# Patient Record
Sex: Male | Born: 1975 | Hispanic: Yes | Marital: Married | Smoking: Never smoker
Health system: Southern US, Community
[De-identification: ages and names within clinical notes are randomized; demographics above are authoritative.]

## PROBLEM LIST (undated history)

## (undated) DIAGNOSIS — K2970 Gastritis, unspecified, without bleeding: Secondary | ICD-10-CM

## (undated) DIAGNOSIS — K219 Gastro-esophageal reflux disease without esophagitis: Secondary | ICD-10-CM

## (undated) DIAGNOSIS — F32A Depression, unspecified: Secondary | ICD-10-CM

## (undated) DIAGNOSIS — R945 Abnormal results of liver function studies: Secondary | ICD-10-CM

## (undated) DIAGNOSIS — F329 Major depressive disorder, single episode, unspecified: Secondary | ICD-10-CM

## (undated) DIAGNOSIS — R7989 Other specified abnormal findings of blood chemistry: Secondary | ICD-10-CM

## (undated) HISTORY — DX: Abnormal results of liver function studies: R94.5

## (undated) HISTORY — DX: Depression, unspecified: F32.A

## (undated) HISTORY — DX: Other specified abnormal findings of blood chemistry: R79.89

## (undated) HISTORY — DX: Gastro-esophageal reflux disease without esophagitis: K21.9

## (undated) HISTORY — PX: OTHER SURGICAL HISTORY: SHX169

## (undated) HISTORY — DX: Gastritis, unspecified, without bleeding: K29.70

## (undated) HISTORY — PX: NO PAST SURGERIES: SHX2092

## (undated) HISTORY — DX: Major depressive disorder, single episode, unspecified: F32.9

---

## 2011-07-31 ENCOUNTER — Ambulatory Visit: Payer: Self-pay

## 2011-07-31 ENCOUNTER — Ambulatory Visit: Payer: Self-pay | Admitting: Family Medicine

## 2011-07-31 VITALS — BP 121/75 | HR 91 | Temp 98.5°F | Resp 18 | Ht 66.0 in | Wt 154.0 lb

## 2011-07-31 DIAGNOSIS — R519 Headache, unspecified: Secondary | ICD-10-CM

## 2011-07-31 DIAGNOSIS — R5383 Other fatigue: Secondary | ICD-10-CM

## 2011-07-31 DIAGNOSIS — R05 Cough: Secondary | ICD-10-CM

## 2011-07-31 DIAGNOSIS — R059 Cough, unspecified: Secondary | ICD-10-CM

## 2011-07-31 DIAGNOSIS — R062 Wheezing: Secondary | ICD-10-CM

## 2011-07-31 MED ORDER — AZITHROMYCIN 250 MG PO TABS: ORAL_TABLET | ORAL | Status: AC

## 2011-07-31 MED ORDER — BENZONATATE 100 MG PO CAPS: 100.0000 mg | ORAL_CAPSULE | Freq: Three times a day (TID) | ORAL | Status: AC | PRN

## 2011-07-31 MED ORDER — ALBUTEROL SULFATE HFA 108 (90 BASE) MCG/ACT IN AERS: 2.0000 | INHALATION_SPRAY | RESPIRATORY_TRACT | Status: DC | PRN

## 2011-07-31 NOTE — Patient Instructions (Signed)
Get lots of rest and drink at least 64 ounces of water daily.  Restart the medicine for your gastritis. If you are not SUBSTANTIALLY better when you complete the antibiotic, return for re-evaluation.  Come back sooner if you get worse.

## 2011-07-31 NOTE — Progress Notes (Signed)
  Subjective:    Patient ID: Anthony Kirby, male    DOB: Sep 08, 1975, 36 y.o.   MRN: [id number]  HPI  Presents with cough, HA and fatigue x 2 weeks.  Initially also had sinus symptoms, but those are now resolved.  Describes the sensation of "something" in his lung on the right. No fever, chills, GI or GU symptoms.  No myalgias, arthralgias or rash. No SOB.  Non-smoker.  No history of lung problems or asthma. History of gastritis.  Usually takes omeprazole, but stopped it when these symptoms began.  Review of Systems As above.    Objective:   Physical Exam  Vital signs noted. Well-developed, well nourished [place] man who is awake, alert and oriented, in NAD. HEENT: [place]/AT, PERRL, EOMI.  Sclera and conjunctiva are clear.  Fundi are normal bilaterally. EAC are patent, TMs are normal in appearance. Nasal mucosa is pink and moist. OP is clear. Neck: supple, non-tender, no lymphadenopathy, thyromegaly. Heart: RRR, no murmur Lungs: high-pitched musical wheezes throughout, worse on the right than the left. Abdomen: normo-active bowel sounds, supple, non-tender, no mass or organomegaly. Extremities: no cyanosis, clubbing or edema. Skin: warm and dry without rash.   CXR: [facility] reading (PRIMARY) by  Dr. Conley Rolls. Increased markings bilaterally, but no discrete infiltrates.      Assessment & Plan:   1. Cough  DG Chest 2 View, azithromycin (ZITHROMAX) 250 MG tablet, benzonatate (TESSALON) 100 MG capsule  2. Wheezing  DG Chest 2 View, albuterol (PROVENTIL HFA;VENTOLIN HFA) 108 (90 BASE) MCG/ACT inhaler  3. HA (headache)    4. Fatigue     Patient Instructions  Get lots of rest and drink at least 64 ounces of water daily.  Restart the medicine for your gastritis. If you are not SUBSTANTIALLY better when you complete the antibiotic, return for re-evaluation.  Come back sooner if you get worse.   Discussed with Dr. Conley Rolls.

## 2011-08-06 ENCOUNTER — Ambulatory Visit: Payer: Self-pay | Admitting: Family Medicine

## 2011-08-06 VITALS — BP 99/64 | HR 73 | Temp 97.9°F | Resp 16 | Ht 65.5 in | Wt 153.8 lb

## 2011-08-06 DIAGNOSIS — H109 Unspecified conjunctivitis: Secondary | ICD-10-CM

## 2011-08-06 DIAGNOSIS — J40 Bronchitis, not specified as acute or chronic: Secondary | ICD-10-CM

## 2011-08-06 MED ORDER — TOBRAMYCIN 0.3 % OP SOLN: 1.0000 [drp] | Freq: Four times a day (QID) | OPHTHALMIC | Status: AC

## 2011-08-06 MED ORDER — AZITHROMYCIN 250 MG PO TABS: ORAL_TABLET | ORAL | Status: AC

## 2011-08-06 MED ORDER — PREDNISONE 20 MG PO TABS: 40.0000 mg | ORAL_TABLET | Freq: Every day | ORAL | Status: AC

## 2011-08-06 NOTE — Progress Notes (Signed)
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 He's here because of progressive eye redness with discharge, and also because of persistent cough and muscle aches for 3 weeks. He's tried Zyrtec without benefit. No history of asthma. Nonsmoker. Objective: Mild swelling of both eyelids, injected sclera but normal extraocular motion. Fundi normal.  Oropharynx: Mild erythema of the uvula  Neck: Supple no adenopathy or thyromegaly  Chest: Few rhonchi otherwise clear  Skin: No rashes  Heart: Regular, no murmur or gallop  Assessment: Acute bronchitis-persistent, conjunctivitis, acute  Plan: Good hygiene, Tobrex drops 4 times a day, Z-Pak, prednisone

## 2011-08-06 NOTE — Patient Instructions (Signed)
Bronquitis (Bronchitis) La bronquitis es Morgan [employer] (el modo que tiene el organismo de Publishing rights manager a una lesin o infeccin) de los bronquios Los bronquios son los conductos que se extienden desde la trquea The First American. Si la inflamacin se agrava, puede causar la falta de aire. CAUSAS Las causas de la inflamacin pueden ser:  Un virus   Grmenes (bacteria).   Polvo   Alergenos   La polucin y muchos otros irritantes  Las clulas que revisten el rbol bronquial estn cubiertas con pequeos pelos (cilias). Esta constantemente producen un movimiento desde los pulmones hacia la boca. De este modo se mantienen los pulmones libres de polucin. Cuando estas clulas se irritan y no pueden cumplir su funcin, comienza a formarse la mucosidad. Esto produce la caracterstica tos de la bronquitis. La tos es el mecanismo por el cual se limpian los pulmones cuando las cilias no pueden cumplir su funcin. Sin alguno de CBS Corporation, Animator se Psychologist, clinical los pulmones Entonces se desarrollara una pulmona.  El fumar es una de las causas ms frecuentes de bronquitis y puede contribuir a la neumona. Abandonar este hbito es lo ms importante que puede hacer para beneficiarse. TRATAMIENTO  El Office Depot prescribir antibiticos si la causa es una bacteria, y medicamentos para abrir las vas areas y Personnel officer. Tambin puede recomendar o prescribir un expectorante. El expectorante aflojar la mucosidad para que pueda eliminarla. Slo tome medicamentos de Sales promotion account executive o prescriptos para Primary school teacher, las [place], o bajar la fiebre segn las indicaciones de su mdico.   Pharmacologist todo lo que causa el problema (por ejemplo el hbito de Art therapist) es fundamental para evitar que empeore.   Un antitusgeno puede prescribirse para [employer]bury [employer] Group de la tos.   Podrn indicarle inhalantes para aliviar los sntomas actuales y ayudar a prevenir problemas futuros.    Aquellos que sufren bronquitis crnica (recurrente) puede ser necesaria la administracin de corticoides.  SOLICITE ATENCIN MDICA INMEDIATAMENTE SI:  Durante el tratamiento observa que elimina esputo similar a pus (purulento).   Tiene fiebre.   Su beb tiene ms de 3 meses y su temperatura rectal es de 102 F (38.9 C) o ms.   Su beb tiene 3 meses o menos y su temperatura rectal es de 100.4 F (38 C) o ms.   Se siente cada vez ms enfermo.   Tiene cada vez ms dificultad para respirar, tiene ruidos al respirar o Company secretary.  Es necesario buscar atencin mdica inmediata si es [place] persona de edad avanzada o sufre alguna otra enfermedad. ASEGURESE DE QUE:   Comprende estas instrucciones.   Controlar su enfermedad.   Solicitar ayuda inmediatamente si no mejora o si empeora.  Document Released: 03/13/2005 Document Revised: 03/02/2011 [facility] Patient Information 2012 [place], [place].Conjuntivitis (Conjunctivitis) Usted padece conjuntivitis. La conjuntivitis se conoce frecuentemente como "ojo rojo". Las causas de la conjuntivitis pueden ser las infecciones virales o [place], Environmental consultant o lesiones. Los sntomas son: enrojecimiento de la superficie del ojo, picazn, molestias y en algunos casos, secreciones. La secrecin se deposita en las pestaas. Las infecciones virales causan una secrecin acuosa, mientras que las infecciones bacterianas causan una secrecin amarillenta y espesa. La conjuntivitis es muy contagiosa y se disemina por el contacto directo. Devon Energy parte del tratamiento le indicaran gotas oftlmicas con antibiticos. Antes de Apache Corporation, retire todas la secreciones del ojo, lavndolo suavemente con agua tibia y algodn. Contine con el uso del medicamento hasta que se haya Unisys Corporation  maanas sin secrecin ocular. No se frote los ojos. Esto hace que aumente la irritacin y favorece la extensin de la infeccin. No utilice las Lear Corporation  miembros de [place]. Lvese las manos con agua y [place] antes y despus de tocarse los ojos. Utilice compresas fras para reducir Chief Technology Officer y anteojos de sol para disminuir la irritacin que ocasiona la luz. No debe usarse maquillaje ni lentes de contacto hasta que la infeccin haya desaparecido. SOLICITE ATENCIN MDICA SI:  Sus sntomas no mejoran luego de [address]  [address] de [place].   Aumenta el dolor o las dificultades para ver.   La zona externa de los prpados est muy roja o hinchada.  Document Released: 03/13/2005 Document Revised: 03/02/2011 [facility] Patient Information 2012 [place], [place].

## 2011-08-13 ENCOUNTER — Ambulatory Visit: Payer: Self-pay | Admitting: Family Medicine

## 2011-08-13 VITALS — BP 129/75 | HR 75 | Temp 97.9°F | Resp 18 | Ht 66.0 in | Wt 154.0 lb

## 2011-08-13 DIAGNOSIS — J029 Acute pharyngitis, unspecified: Secondary | ICD-10-CM

## 2011-08-13 DIAGNOSIS — B351 Tinea unguium: Secondary | ICD-10-CM

## 2011-08-13 MED ORDER — TERBINAFINE HCL 250 MG PO TABS: 250.0000 mg | ORAL_TABLET | Freq: Every day | ORAL | Status: AC

## 2011-08-13 MED ORDER — DOXYCYCLINE HYCLATE 100 MG PO TABS: 100.0000 mg | ORAL_TABLET | Freq: Two times a day (BID) | ORAL | Status: AC

## 2011-08-13 NOTE — Patient Instructions (Signed)
Tia de las uas (Ringworm, Nail) Usted presenta una infeccin por hongos en las uas de los pies. La parte visible de las uas est formada por clulas muertas que no tienen suministro sanguneo que intervenga en la prevencin de las infecciones. La infeccin se produce debido a que los hongos estn en todas partes. Aprovecharn cualquier oportunidad para crecer Presenter, broadcasting. Esto incluye los tejidos de su cuerpo formados por General Electric.  [place] uas tienen un crecimiento muy lento, requieren [place] 2 aos de tratamiento con medicamentos antimicticos. La infeccin involucra a toda la ua, hasta la base. Incluye aproximadamente 1/3 de la ua que no puede verse. Si el profesional le ha prescrito un medicamento por boca, United Auto. No podr ver ningn progreso hasta que hayan transcurrido entre 6 y 9 meses. No debe preocuparse. La curacin es lenta. Se debe a que el medicamento llega hasta la infeccin de manera muy lenta. Los hongos pueden vivir sobre las clulas muertas con poca o casi ninguna exposicin al suministro de [place]. Esta tambin es la razn por la cual no se observa mejora en los primeros 6 meses. La ua comienza la curacin en la base, donde hay suministro de [place]. La medicacin tpica, como las cremas y los ungentos generalmente no son eficaces. Channing Mutters al profesional podrn elegir acelerar el proceso de curacin con la extraccin quirrgica de todas las uas. An as, Pharmacist, hospital 6 y 9 meses de medicamentos por va oral adicionales. Concurra a la Training and development officer profesional que lo asiste de acuerdo a lo que le haya indicado. Recuerde que no observar mejora durante al menos 6 meses. Consulte antes con el profesional si aparecen otros signos de infeccin (p. ej. enrojecimiento e hinchazn). Document Released: 12/21/2004 Document Revised: 03/02/2011 [facility] Patient Information 2012 [place], [place].

## 2011-08-13 NOTE — Progress Notes (Signed)
This 36 year old man comes back because of persistent sore throat and also wants to have his nails evaluated.  Objective: Patient is mild thickening and irritation around the distal aspects of his toenails and fingernails.  Throat is red with mild exudate on the left.  Neck is supple without adenopathy  His respirations normal  Assessment: Persistent pharyngitis and onychomycosis  Plan: Doxycycline for the sore throat x1 week and the Lamisil for one month

## 2012-03-23 ENCOUNTER — Ambulatory Visit: Payer: Self-pay | Admitting: Emergency Medicine

## 2012-03-23 VITALS — BP 105/66 | HR 82 | Temp 98.6°F | Resp 16 | Ht 66.5 in | Wt 162.0 lb

## 2012-03-23 DIAGNOSIS — K047 Periapical abscess without sinus: Secondary | ICD-10-CM

## 2012-03-23 DIAGNOSIS — J029 Acute pharyngitis, unspecified: Secondary | ICD-10-CM

## 2012-03-23 MED ORDER — CLINDAMYCIN HCL 300 MG PO CAPS: 300.0000 mg | ORAL_CAPSULE | Freq: Three times a day (TID) | ORAL | Status: DC

## 2012-03-23 NOTE — Progress Notes (Signed)
[facility] [address] [place] [address] [phone]  Date:  03/23/2012   Name:  Anthony Kirby   DOB:  1975/08/03   MRN:  [id number]  PCP:  No primary provider on file.    Chief Complaint: Sore Throat and Fever   History of Present Illness:  Anthony Kirby is a 36 y.o. very pleasant male patient who presents with the following:  Ill three days with sore throat and fever.  No cough but has nasal congestion and drainage. Nonpurulent.  Nausea and vomiting yesterday a couple times.  Poor po intake.  Has headache.  No rash or stool change.  Dental pain and left facial swelling past three weeks now worse.  Has a fractured tooth with an exposed root.  There is no problem list on file for this patient.   Past Medical History  Diagnosis Date  . Gastritis     History reviewed. No pertinent past surgical history.  History  Substance Use Topics  . Smoking status: Never Smoker   . Smokeless tobacco: Not on file  . Alcohol Use: No    Family History  Problem Relation Age of Onset  . Hypertension Mother     Allergies  Allergen Reactions  . Penicillins Hives    Medication list has been reviewed and updated.  Current Outpatient Prescriptions on File Prior to Visit  Medication Sig Dispense Refill  . terbinafine (LAMISIL) 250 MG tablet Take 1 tablet (250 mg total) by mouth daily.  30 tablet  2    Review of Systems:  As per HPI, otherwise negative.    Physical Examination: Filed Vitals:   03/23/12 1413  BP: 105/66  Pulse: 82  Temp: 98.6 F (37 C)  Resp: 16   Filed Vitals:   03/23/12 1413  Height: 5' 6.5" (1.689 m)  Weight: 162 lb (73.483 kg)   Body mass index is 25.76 kg/(m^2). Ideal Body Weight: Weight in (lb) to have BMI = 25: 156.9   GEN: WDWN, NAD, Non-toxic, A & O x 3 HEENT: Atraumatic, Normocephalic. Neck supple. No masses, No LAD.  Erythema no exudate.  Has left second molar mandible with large defect and facial swelling no visible  abscess Ears and Nose: No external deformity.  TM negative CV: RRR, No M/G/R. No JVD. No thrill. No extra heart sounds. PULM: CTA B, no wheezes, crackles, rhonchi. No retractions. No resp. distress. No accessory muscle use. ABD: S, NT, ND, +BS. No rebound. No HSM. EXTR: No c/c/e NEURO Normal gait.  PSYCH: Normally interactive. Conversant. Not depressed or anxious appearing.  Calm demeanor.    Assessment and Plan: Dental infection Pharyngitis Clindamycin Follow up as needed and with dentist  Carmelina Dane, MD

## 2012-03-23 NOTE — Patient Instructions (Addendum)
Absceso dental  (Dental Abscess)  Un absceso dental es la acumulacin de lquido infectado (pus) debido a una infeccin bacteriana en la parte interna del diente (pulpa). Generalmente se produce en la punta de la raz del diente.  CAUSAS   Caries dentales graves.  Traumatismo dental que permite el ingreso de bacterias en la pulpa, como en el caso de un diente roto o [place]. SNTOMAS   Dolor intenso en el interior y alrededor del diente infectado.  Hinchazn y enrojecimiento alrededor del diente, en la boca o en la cara.  Sensibilidad.  Secrecin de pus.  Mal aliento.  Gusto desagradable en la boca.  Dificultad para tragar.  Dificultad para abrir Government social research officer.  Nuseas.  Vmitos.  Escalofros.  Ganglios hinchados en el cuello. DIAGNSTICO   Deben tomarle una historia clnica y dental.  El examen consistir en punzar el absceso del diente.  Le tomarn radiografas del diente para identificar el absceso. TRATAMIENTO  El objetivo del tratamiento es eliminar la infeccin. Le indicarn antibiticos para evitar que la infeccin se extienda. Para salvar el diente, el dentista podra realizarle un tratamiento de conducto. Si el diente no puede salvarse, habr que sacarlo (extraerlo) y se drenar el absceso.  INSTRUCCIONES PARA EL CUIDADO EN EL HOGAR   Slo tome medicamentos de venta libre o recetados para The PNC Financial, la fiebre, o el [place], segn las indicaciones de su mdico.  Enjuguese la boca con frecuencia (haga buches) con agua y sal ( de cucharadita de sal en 240 ml. de agua tibia) para aliviar el dolor y la inflamacin.  No conduzca mientras toma analgsicos (narcticos).  No aplique calor en la parte externa del rostro.  Regrese para completar el tratamiento con el dentista, segn las indicaciones. SOLICITE ATENCIN MDICA SI:   El dolor no se alivia con los United Parcel.  El dolor empeora en vez de [place]. SOLICITE ATENCIN MDICA DE INMEDIATO SI:    Tiene fiebre o sntomas persistentes durante ms de 2  3 das.  Tiene fiebre y los sntomas [address]  Siente escalofros o un dolor de cabeza muy intenso.  Tiene problemas para respirar o tragar.  Tiene dificultad para abrir Government social research officer.  Observa hinchazn en el cuello o alrededor del ojo. Document Released: 03/13/2005 Document Revised: 09/12/2011 [facility] Patient Information 2013 [place], [place].

## 2014-07-24 ENCOUNTER — Ambulatory Visit (INDEPENDENT_AMBULATORY_CARE_PROVIDER_SITE_OTHER): Payer: Self-pay | Admitting: Family Medicine

## 2014-07-24 VITALS — BP 104/72 | HR 92 | Temp 98.4°F | Resp 20 | Ht 66.75 in | Wt 160.1 lb

## 2014-07-24 DIAGNOSIS — R059 Cough, unspecified: Secondary | ICD-10-CM

## 2014-07-24 DIAGNOSIS — R05 Cough: Secondary | ICD-10-CM

## 2014-07-24 DIAGNOSIS — J309 Allergic rhinitis, unspecified: Secondary | ICD-10-CM

## 2014-07-24 DIAGNOSIS — K219 Gastro-esophageal reflux disease without esophagitis: Secondary | ICD-10-CM

## 2014-07-24 MED ORDER — AZITHROMYCIN 250 MG PO TABS: ORAL_TABLET | ORAL | Status: DC

## 2014-07-24 NOTE — Patient Instructions (Signed)
claritin cada dia por alergias, mucinex por tos, y omeprazole cada dia por acido. si su tos remanecer en 5 dias - tome antibiotico (azithromycin)  regrese si su simptomas empeorse.   Cough, Adult  A cough is a reflex that helps clear your throat and airways. It can help heal the body or may be a reaction to an irritated airway. A cough may only last 2 or 3 weeks (acute) or may last more than 8 weeks (chronic).  CAUSES Acute cough:  Viral or bacterial infections. Chronic cough:  Infections.  Allergies.  Asthma.  Post-nasal drip.  Smoking.  Heartburn or acid reflux.  Some medicines.  Chronic lung problems (COPD).  Cancer. SYMPTOMS   Cough.  Fever.  Chest pain.  Increased breathing rate.  High-pitched whistling sound when breathing (wheezing).  Colored mucus that you cough up (sputum). TREATMENT   A bacterial cough may be treated with antibiotic medicine.  A viral cough must run its course and will not respond to antibiotics.  Your caregiver may recommend other treatments if you have a chronic cough. HOME CARE INSTRUCTIONS   Only take over-the-counter or prescription medicines for pain, discomfort, or fever as directed by your caregiver. Use cough suppressants only as directed by your caregiver.  Use a cold steam vaporizer or humidifier in your bedroom or home to help loosen secretions.  Sleep in a semi-upright position if your cough is worse at night.  Rest as needed.  Stop smoking if you smoke. SEEK IMMEDIATE MEDICAL CARE IF:   You have pus in your sputum.  Your cough starts to worsen.  You cannot control your cough with suppressants and are losing sleep.  You begin coughing up blood.  You have difficulty breathing.  You develop pain which is getting worse or is uncontrolled with medicine.  You have a fever. MAKE SURE YOU:   Understand these instructions.  Will watch your condition.  Will get help right away if you are not doing well or  get worse. Document Released: 09/09/2010 Document Revised: 06/05/2011 Document Reviewed: 09/09/2010 [facility] Patient Information 2015 [place], [place]. This information is not intended to replace advice given to you by your health care provider. Make sure you discuss any questions you have with your health care provider.  Opciones de alimentos para pacientes con reflujo gastroesofgico (Food Choices for Gastroesophageal Reflux Disease) Cuando se tiene reflujo gastroesofgico (ERGE), los alimentos que se ingieren y los hbitos de alimentacin son muy importantes. Elegir los alimentos adecuados puede ayudar a Paramedicaliviar las molestias ocasionadas por el St. JosephERGE. QU PAUTAS GENERALES DEBO SEGUIR?  Elija las frutas, los vegetales, los cereales integrales, los productos lcteos, la carne de [place], de pescado y de ave con bajo contenido de grasas.  Limite las grasas, [address] Lanecomo los [place]lortownaceites, los aderezos para Quinwoodensalada, la Peabodymanteca, los frutos secos y Programme researcher, broadcasting/film/videoel aguacate.  Lleve un registro de las comidas para identificar los alimentos que ocasionan sntomas.  Evite los alimentos que le ocasionen reflujo. Pueden ser distintos para cada persona.  Haga comidas pequeas con frecuencia en lugar de tres comidas OfficeMax Incorporatedabundantes todos los das.  Coma lentamente, en un clima distendido.  Limite el consumo de alimentos fritos.  Cocine los alimentos utilizando mtodos que no sean la fritura.  Evite el consumo alcohol.  Evite beber grandes cantidades de lquidos con las comidas.  Evite agacharse o recostarse hasta despus de 2 o 3horas de haber comido. QU ALIMENTOS NO SE RECOMIENDAN? Los siguientes son algunos alimentos y bebidas que pueden empeorar los sntomas:  Vegetales Tomates. Jugo de tomate. Salsa de tomate y espagueti. Ajes. Cebolla y [place]. Rbano picante. Frutas Naranjas, pomelos y limn (fruta y [place]). Carnes Carnes de [place], de pescado y de ave con gran contenido de grasas. Esto incluye los perros calientes, las  Bend, el Di Giorgio, la salchicha, el salame y el tocino. Lcteos Leche entera y [place]. PPG Industries. Crema. [place]. Helados. Queso crema.  Bebidas Caf y t negro, con o sin cafena Bebidas gaseosas o energizantes. Condimentos Salsa picante. Salsa barbacoa.  Dulces/postres Chocolate y cacao. Rosquillas. Menta y mentol. Grasas y Massachusetts Mutual Life con alto contenido de grasas, incluidas las papas fritas. Otros Vinagre. Especias picantes, como la Brink's Company, la pimienta blanca, la pimienta roja, la pimienta de cayena, el curry en [place], los clavos de [place], el jengibre y el [place] en polvo. Los artculos mencionados arriba pueden no ser Raytheon de las bebidas y los alimentos que se Theatre stage manager. Comunquese con el nutricionista para recibir ms informacin. Document Released: 12/21/2004 Document Revised: 03/18/2013 [facility] Patient Information 2015 [place], [place]. This information is not intended to replace advice given to you by your health care provider. Make sure you discuss any questions you have with your health care provider.   Bronquitis aguda (Acute Bronchitis) La bronquitis es una inflamacin de las vas respiratorias que se extienden desde la trquea Lubrizol Corporation pulmones (bronquios). La inflamacin produce la formacin de mucosidad. Esto produce tos, que es el sntoma ms frecuente de la bronquitis.  Cuando la bronquitis es [place], generalmente comienza de [place] sbita y desaparece luego de un par de semanas. El hbito de fumar, las alergias y el asma pueden empeorar la bronquitis. Los episodios repetidos de bronquitis pueden causar ms problemas pulmonares.  CAUSAS La causa ms frecuente de bronquitis aguda es el mismo virus que produce el resfro. El virus puede propagarse de Neomia Dear persona a la otra (contagioso) a travs de la tos y los estornudos, y al tocar objetos contaminados. SIGNOS Y SNTOMAS   Tos.  Grant Ruts.  Tos con mucosidad.  Dolores PepsiCo  cuerpo.  Congestin en el pecho.  Escalofros.  Falta de aire.  Dolor de Advertising copywriter. DIAGNSTICO  La bronquitis aguda en general se diagnostica con un examen fsico. El mdico tambin le har preguntas sobre su historia clnica. En algunos casos se indican otros estudios, como radiografas, para Risk manager.  TRATAMIENTO  La bronquitis aguda generalmente desaparece en un par de semanas. Con frecuencia, no es Quarry manager. Los medicamentos se indican para aliviar la fiebre o la tos. Generalmente, no es necesario el uso de antibiticos, pero pueden recetarse en ciertas ocasiones. En algunos casos, se recomienda el uso de un inhalador para mejorar la falta de aire y Scientist, physiological tos. Un vaporizador de aire fro podr ayudarlo a MeadWestvaco bronquiales y Statistician su eliminacin.  INSTRUCCIONES PARA EL CUIDADO EN EL HOGAR  Descanse lo suficiente.  Beba lquidos en abundancia para mantener la orina de color claro o amarillo plido (excepto que padezca una enfermedad que requiera la restriccin de lquidos). El aumento de lquidos puede ayudar a que las secreciones respiratorias (esputo) sean menos espesas y a reducir la congestin del pecho, y Transport planner deshidratacin.  Tome los medicamentos solamente como se lo haya indicado el mdico.  Si le recetaron antibiticos, asegrese de terminarlos, incluso si comienza a sentirse mejor.  Evite fumar o aspirar el humo de otros fumadores. La exposicin al humo del cigarrillo o a irritantes qumicos har  que la bronquitis empeore. Si fuma, considere el uso de goma de Theatre manager o la aplicacin de parches en la piel que contengan nicotina para Paramedic los sntomas de abstinencia. Si deja de fumar, sus pulmones se curarn ms rpido.  Reduzca la probabilidad de otro episodio de bronquitis aguda lavando sus manos con frecuencia, evitando a las personas que tengan sntomas y tratando de no tocarse las manos con la  boca, la nariz o los ojos.  Concurra a todas las visitas de control como se lo haya indicado el mdico. SOLICITE ATENCIN MDICA SI: Los sntomas no mejoran despus de una semana de [place].  SOLICITE ATENCIN MDICA DE INMEDIATO SI:  Comienza a tener fiebre o escalofros cada vez ms intensos.  Siente dolor en el pecho.  Le falta el aire de manera preocupante.  La flema tiene [place].  Se deshidrata.  Se desmaya o siente que va a desmayarse de forma repetida.  Tiene vmitos que se repiten.  Tiene un dolor de cabeza intenso. ASEGRESE DE QUE:   Comprende estas instrucciones.  Controlar su afeccin.  Recibir ayuda de inmediato si no mejora o si empeora. Document Released: 03/13/2005 Document Revised: 07/28/2013 [facility] Patient Information 2015 [place], [place]. This information is not intended to replace advice given to you by your health care provider. Make sure you discuss any questions you have with your health care provider.

## 2014-07-24 NOTE — Progress Notes (Signed)
Subjective:    Patient ID: Anthony Kirby, male    DOB: 1975-06-05, 39 y.o.   MRN: [id number]  This chart was scribed for Anthony Staggers, MD by Ronney Lion, ED Scribe. This patient was seen in room 3 and the patient's care was started at 4:57 PM.   HPI  HPI Comments: Anthony Kirby is a 39 y.o. male who presents to the [facility] complaining of constant nasal congestion that began 4 weeks ago. He endorses occasional sneezing and rhinorrhea and states he has been having chest congestion -- a cough productive with white sputum, particularly when lying down at night. Patient states he has also been feeling tired all month. He also complains of "sour," epigastric pain that has been ongoing for a while, particularly when he wakes up in the morning. He denies a history of asthma. He had been taking XL3, a medication for congestion and allergies, with some relief. He denies wheezing, night sweats, leg swelling, or hemoptysis. He denies sick contact. He denies any international travel within the last 17 years. Patient works in Aeronautical engineer at [employer].   There are no active problems to display for this patient.  Past Medical History  Diagnosis Date  . Gastritis    History reviewed. No pertinent past surgical history. Allergies  Allergen Reactions  . Penicillins Hives   Prior to Admission medications   Medication Sig Start Date End Date Taking? Authorizing Provider  clindamycin (CLEOCIN) 300 MG capsule Take 1 capsule (300 mg total) by mouth 3 (three) times daily. Patient not taking: Reported on 07/24/2014 03/23/12   Carmelina Dane, MD   History   Social History  . Marital Status: Single    Spouse Name: N/A  . Number of Children: N/A  . Years of Education: N/A   Occupational History  . Not on file.   Social History Main Topics  . Smoking status: Never Smoker   . Smokeless tobacco: Not on file  . Alcohol Use: No  . Drug Use: Not on file  . Sexual Activity: Not on  file   Other Topics Concern  . Not on file   Social History Narrative      Review of Systems  Constitutional: Positive for fatigue. Negative for diaphoresis.  HENT: Positive for congestion, rhinorrhea and sneezing.   Respiratory: Positive for cough. Negative for wheezing.   Cardiovascular: Negative for leg swelling.       Objective:   Physical Exam  Constitutional: He is oriented to person, place, and time. He appears well-developed and well-nourished.  HENT:  Head: Normocephalic and atraumatic.  Right Ear: Tympanic membrane, external ear and ear canal normal.  Left Ear: Tympanic membrane, external ear and ear canal normal.  Nose: No rhinorrhea.  Mouth/Throat: Oropharynx is clear and moist and mucous membranes are normal. No oropharyngeal exudate or posterior oropharyngeal erythema.  Sinuses are nontender.  Eyes: Conjunctivae are normal. Pupils are equal, round, and reactive to light.  Neck: Neck supple.  Cardiovascular: Normal rate, regular rhythm, normal heart sounds and intact distal pulses.   No murmur heard. Pulmonary/Chest: Effort normal and breath sounds normal. He has no wheezes. He has no rhonchi. He has no rales.  Abdominal: Soft. There is no tenderness.  Lymphadenopathy:    He has no cervical adenopathy.  Neurological: He is alert and oriented to person, place, and time.  Skin: Skin is warm and dry. No rash noted.  Psychiatric: He has a normal mood and affect. His behavior  is normal.  Vitals reviewed.  Filed Vitals:   07/24/14 1645  BP: 104/72  Pulse: 92  Temp: 98.4 F (36.9 C)  TempSrc: Oral  Resp: 20  Height: 5' 6.75" (1.695 m)  Weight: 160 lb 2 oz (72.632 kg)  SpO2: 98%        Assessment & Plan:   Anthony Kirby is a 39 y.o. male Cough - Plan: azithromycin (ZITHROMAX) 250 MG tablet  Gastroesophageal reflux disease, esophagitis presence not specified  Allergic rhinitis, unspecified allergic rhinitis type  Possible multifactorial cough -  allergic/PND vs. reflux/LPR vs. Infectious/bronchitis.   - start with claritin, mucinex, omeprazole over the counter, and trigger avoidance for GERD.,   -if cough not improving in next week - start Zpak.   -rtc precautions.   -spanish and english spoken. Understanding expressed.    Meds ordered this encounter  Medications  . azithromycin (ZITHROMAX) 250 MG tablet    Sig: Take 2 pills by mouth on day 1, then 1 pill by mouth per day on days 2 through 5.    Dispense:  6 tablet    Refill:  0   Patient Instructions  claritin cada dia por alergias, mucinex por tos, y omeprazole cada dia por acido. si su tos remanecer en 5 dias - tome antibiotico (azithromycin)  regrese si su simptomas empeorse.   Cough, Adult  A cough is a reflex that helps clear your throat and airways. It can help heal the body or may be a reaction to an irritated airway. A cough may only last 2 or 3 weeks (acute) or may last more than 8 weeks (chronic).  CAUSES Acute cough:  Viral or bacterial infections. Chronic cough:  Infections.  Allergies.  Asthma.  Post-nasal drip.  Smoking.  Heartburn or acid reflux.  Some medicines.  Chronic lung problems (COPD).  Cancer. SYMPTOMS   Cough.  Fever.  Chest pain.  Increased breathing rate.  High-pitched whistling sound when breathing (wheezing).  Colored mucus that you cough up (sputum). TREATMENT   A bacterial cough may be treated with antibiotic medicine.  A viral cough must run its course and will not respond to antibiotics.  Your caregiver may recommend other treatments if you have a chronic cough. HOME CARE INSTRUCTIONS   Only take over-the-counter or prescription medicines for pain, discomfort, or fever as directed by your caregiver. Use cough suppressants only as directed by your caregiver.  Use a cold steam vaporizer or humidifier in your bedroom or home to help loosen secretions.  Sleep in a semi-upright position if your cough is worse  at night.  Rest as needed.  Stop smoking if you smoke. SEEK IMMEDIATE MEDICAL CARE IF:   You have pus in your sputum.  Your cough starts to worsen.  You cannot control your cough with suppressants and are losing sleep.  You begin coughing up blood.  You have difficulty breathing.  You develop pain which is getting worse or is uncontrolled with medicine.  You have a fever. MAKE SURE YOU:   Understand these instructions.  Will watch your condition.  Will get help right away if you are not doing well or get worse. Document Released: 09/09/2010 Document Revised: 06/05/2011 Document Reviewed: 09/09/2010 [facility] Patient Information 2015 [place], [place]. This information is not intended to replace advice given to you by your health care provider. Make sure you discuss any questions you have with your health care provider.  Opciones de alimentos para pacientes con reflujo gastroesofgico (Food Choices for Gastroesophageal Reflux  Disease) Cuando se tiene reflujo gastroesofgico (ERGE), los alimentos que se ingieren y los hbitos de alimentacin son muy importantes. Elegir los alimentos adecuados puede ayudar a Paramedic las molestias ocasionadas por el [place]. QU PAUTAS GENERALES DEBO SEGUIR?  Elija las frutas, los vegetales, los cereales integrales, los productos lcteos, la carne de [place], de pescado y de ave con bajo contenido de grasas.  Limite las grasas, [address] [place], los aderezos para [place], la Zeb, los frutos secos y Programme researcher, broadcasting/film/video.  Lleve un registro de las comidas para identificar los alimentos que ocasionan sntomas.  Evite los alimentos que le ocasionen reflujo. Pueden ser distintos para cada persona.  Haga comidas pequeas con frecuencia en lugar de tres comidas [employer].  Coma lentamente, en un clima distendido.  Limite el consumo de alimentos fritos.  Cocine los alimentos utilizando mtodos que no sean la fritura.  Evite el consumo  alcohol.  Evite beber grandes cantidades de lquidos con las comidas.  Evite agacharse o recostarse hasta despus de 2 o 3horas de haber comido. QU ALIMENTOS NO SE RECOMIENDAN? Los siguientes son algunos alimentos y bebidas que pueden empeorar los sntomas: Veterinary surgeon. Jugo de tomate. Salsa de tomate y espagueti. Ajes. Cebolla y [place]. Rbano picante. Frutas Naranjas, pomelos y limn (fruta y [place]). Carnes Carnes de [place], de pescado y de ave con gran contenido de grasas. Esto incluye los perros calientes, las [place], el [place], la salchicha, el salame y el tocino. Lcteos Leche entera y [place]. [employer]PG [employer]. Crema. [place]. Helados. Queso crema.  Bebidas Caf y t negro, con o sin cafena Bebidas gaseosas o energizantes. Condimentos Salsa picante. Salsa barbacoa.  Dulces/postres Chocolate y cacao. Rosquillas. Menta y mentol. Grasas y [employer] con alto contenido de grasas, incluidas las papas fritas. Otros Vinagre. Especias picantes, como la [employer], la pimienta blanca, la pimienta roja, la pimienta de cayena, el curry en [place], los clavos de [place], el jengibre y el [place] en polvo. Los artculos mencionados arriba pueden no ser Ray[employer]on de las bebidas y los alimentos que se Theatre stage manager. Comunquese con el nutricionista para recibir ms informacin. Document Released: 12/21/2004 Document Revised: 03/18/2013 [facility] Patient Information 2015 [place], [place]. This information is not intended to replace advice given to you by your health care provider. Make sure you discuss any questions you have with your health care provider.   Bronquitis aguda (Acute Bronchitis) La bronquitis es una inflamacin de las vas respiratorias que se extienden desde la trquea [employer] Corporation pulmones (bronquios). La inflamacin produce la formacin de mucosidad. Esto produce tos, que es el sntoma ms frecuente de la bronquitis.  Cuando la bronquitis es [place],  generalmente comienza de [place] sbita y desaparece luego de un par de semanas. El hbito de fumar, las alergias y el asma pueden empeorar la bronquitis. Los episodios repetidos de bronquitis pueden causar ms problemas pulmonares.  CAUSAS La causa ms frecuente de bronquitis aguda es el mismo virus que produce el resfro. El virus puede propagarse de Neomia Dear persona a la otra (contagioso) a travs de la tos y los estornudos, y al tocar objetos contaminados. SIGNOS Y SNTOMAS   Tos.  Grant Ruts.  Tos con mucosidad.  Dolores PepsiCo cuerpo.  Congestin en el pecho.  Escalofros.  Falta de aire.  Dolor de Advertising copywriter. DIAGNSTICO  La bronquitis aguda en general se diagnostica con un examen fsico. El mdico tambin le har preguntas sobre su historia clnica. En algunos casos se indican otros estudios, como  radiografas, para descartar otras enfermedades.  TRATAMIENTO  La bronquitis aguda generalmente desaparece en un par de semanas. Con frecuencia, no es Quarry managernecesario realizar un tratamiento. Los medicamentos se indican para aliviar la fiebre o la tos. Generalmente, no es necesario el uso de antibiticos, pero pueden recetarse en ciertas ocasiones. En algunos casos, se recomienda el uso de un inhalador para mejorar la falta de aire y Scientist, physiologicalcontrolar la tos. Un vaporizador de aire fro podr ayudarlo a MeadWestvacodisolver las secreciones bronquiales y Statisticianfacilitar su eliminacin.  INSTRUCCIONES PARA EL CUIDADO EN EL HOGAR  Descanse lo suficiente.  Beba lquidos en abundancia para mantener la orina de color claro o amarillo plido (excepto que padezca una enfermedad que requiera la restriccin de lquidos). El aumento de lquidos puede ayudar a que las secreciones respiratorias (esputo) sean menos espesas y a reducir la congestin del pecho, y Transport plannerevitar la deshidratacin.  Tome los medicamentos solamente como se lo haya indicado el mdico.  Si le recetaron antibiticos, asegrese de terminarlos, incluso si comienza a  sentirse mejor.  Evite fumar o aspirar el humo de otros fumadores. La exposicin al humo del cigarrillo o a irritantes qumicos har que la bronquitis empeore. Si fuma, considere el uso de goma de Theatre managermascar o la aplicacin de parches en la piel que contengan nicotina para Paramedicaliviar los sntomas de abstinencia. Si deja de fumar, sus pulmones se curarn ms rpido.  Reduzca la probabilidad de otro episodio de bronquitis aguda lavando sus manos con frecuencia, evitando a las personas que tengan sntomas y tratando de no tocarse las manos con la boca, la nariz o los ojos.  Concurra a todas las visitas de control como se lo haya indicado el mdico. SOLICITE ATENCIN MDICA SI: Los sntomas no mejoran despus de una semana de Owatonnatratamiento.  SOLICITE ATENCIN MDICA DE INMEDIATO SI:  Comienza a tener fiebre o escalofros cada vez ms intensos.  Siente dolor en el pecho.  Le falta el aire de manera preocupante.  La flema tiene Hammondsangre.  Se deshidrata.  Se desmaya o siente que va a desmayarse de forma repetida.  Tiene vmitos que se repiten.  Tiene un dolor de cabeza intenso. ASEGRESE DE QUE:   Comprende estas instrucciones.  Controlar su afeccin.  Recibir ayuda de inmediato si no mejora o si empeora. Document Released: 03/13/2005 Document Revised: 07/28/2013 [facility] Patient Information 2015 [place]. This information is not intended to replace advice given to you by your health care provider. Make sure you discuss any questions you have with your health care provider.     I personally performed the services described in this documentation, which was scribed in my presence. The recorded information has been reviewed and considered, and addended by me as needed.

## 2014-07-30 ENCOUNTER — Ambulatory Visit (INDEPENDENT_AMBULATORY_CARE_PROVIDER_SITE_OTHER): Payer: Self-pay | Admitting: Family Medicine

## 2014-07-30 VITALS — BP 140/90 | HR 76 | Temp 98.1°F | Resp 20 | Ht 66.0 in | Wt 158.4 lb

## 2014-07-30 DIAGNOSIS — J387 Other diseases of larynx: Secondary | ICD-10-CM

## 2014-07-30 DIAGNOSIS — K219 Gastro-esophageal reflux disease without esophagitis: Secondary | ICD-10-CM

## 2014-07-30 DIAGNOSIS — R059 Cough, unspecified: Secondary | ICD-10-CM

## 2014-07-30 DIAGNOSIS — F329 Major depressive disorder, single episode, unspecified: Secondary | ICD-10-CM

## 2014-07-30 DIAGNOSIS — R05 Cough: Secondary | ICD-10-CM

## 2014-07-30 DIAGNOSIS — F32A Depression, unspecified: Secondary | ICD-10-CM

## 2014-07-30 MED ORDER — SERTRALINE HCL 50 MG PO TABS: 50.0000 mg | ORAL_TABLET | Freq: Every day | ORAL | Status: DC

## 2014-07-30 MED ORDER — OMEPRAZOLE 20 MG PO CPDR: 20.0000 mg | DELAYED_RELEASE_CAPSULE | Freq: Every day | ORAL | Status: DC

## 2014-07-30 NOTE — Patient Instructions (Signed)
Omeprazole cada dia por acido. Sertraline cada dia por depression. si su acido no mejor en una mes - regrese.  regrese en 6 meses por depresion. Mas temprano si empeorse  Reflujo gastroesofgico - Adultos  (Gastroesophageal Reflux Disease, Adult)  El reflujo gastroesofgico ocurre cuando el cido del estmago pasa al esfago. Cuando el cido entra en contacto con el esfago, el cido provoca dolor (inflamacin) en el esfago. Con el tiempo, pueden formarse pequeos agujeros (lceras) en el revestimiento del esfago. CAUSAS  1. Exceso de Runner, broadcasting/film/videopeso corporal. Esto aplica presin Eli Lilly and Companysobre el estmago, lo que hace que el cido del estmago suba hacia el esfago. 2. El hbito de fumar Aumenta la produccin de cido en el Edenestmago. 3. El consumo de alcohol. Provoca disminucin de la presin en el esfnter esofgico inferior (vlvula o anillo de msculo entre el esfago y Investment banker, corporateel estmago), permitiendo que el cido del estmago suba hacia el esfago. 4. Cenas a ltima hora del da y estmago lleno. Aumenta la presin y la produccin de cido en el estmago. 5. Malformacin en el esfnter esofgico inferior. A menudo no se halla causa.  SNTOMAS   Ardor y Radiographer, therapeuticdolor en la parte inferior del pecho detrs del esternn y en la zona media del Cunardestmago. Puede ocurrir Toys 'R' Usdos veces por semana o ms a menudo.  Dificultad para tragar.  Dolor de Advertising copywritergarganta.  Tos seca.  Sntomas similares al asma que incluyen sensacin de opresin en el pecho, falta de aire y sibilancias. DIAGNSTICO  El mdico diagnosticar el problema basndose en los sntomas. En algunos casos, se indican radiografas y otras pruebas para verificar si hay complicaciones o para comprobar el estado del [address]est[address] y Training and development officerel esfago.  TRATAMIENTO  El mdico le indicar medicamentos de venta libre o recetados para ayudar a disminuir la produccin de cido. Consulte con su mdico antes de Corporate investment bankerempezar o agregar cualquier medicamento nuevo.  INSTRUCCIONES PARA EL CUIDADO EN EL  HOGAR   Modifique los factores que pueda cambiar. Consulte con su mdico para solicitar orientacin relacionada con la prdida de peso, dejar de fumar y el consumo de alcohol.  Evite las comidas y bebidas que [address] Avenueempeoran los Lyndon Centerproblemas, [place]:  MinnesotaBebidas con cafena o alcohlicas.  Chocolate.  Sabores a Advertising account plannermenta.  Ajo y cebolla.  Comidas muy condimentadas.  Ctricos como naranjas, limones o limas.  Alimentos que contengan tomate, como salsas, [place] y pizza.  Alimentos fritos y Lexicographergrasos.  Evite acostarse durante 3 horas antes de irse a dormir o antes de tomar una siesta.  Haga comidas pequeas durante Glass blower/designerel da en lugar de 3 comidas abundantes.  Use ropas sueltas. No use nada apretado alrededor de la cintura que cause presin en el estmago.  Levante (eleve) la cabecera de la cama 6 a 8 pulgadas (15 a 20 cm) con bloques de madera. Usar almohadas extra no ayuda.  Solo tome medicamentos que se pueden comprar sin receta o recetados para el dolor, Dentistmalestar o fiebre, como le indica el mdico.  No tome aspirina, ibuprofeno ni antiinflamatorios no esteroides. SOLICITE ATENCIN MDICA DE Engelhard CorporationNMEDIATO SI:   Goldman SachsSiente dolor en los brazos, el cuello, la Alburnettmandbula, los dientes o la espalda.  El dolor aumenta o cambia la intensidad o la durancin.  Tiene nuseas, vmitos o sudoracin(diaforesis).  Siente falta de aire o dolor en el pecho, o se desmaya.  Vomita y el vmito tiene Atascocitasangre, es de color [place], Keytesvilleamarillo, negro o es similar a la borra del caf o tiene Mammothsangre.  Las heces son rojas, sanguinolentas  o negras. Estos sntomas pueden ser signos de [address] - [address]ros problemas, como enfermedades cardacas, hemorragias gstrias o sangrado esofgico.  ASEGRESE DE QUE:   Comprende estas instrucciones.  Controlar su enfermedad.  Solicitar ayuda de inmediato si no mejora o si empeora. Document Released: 12/21/2004 Document Revised: 06/05/2011 [facility]Exit[facility] Patient Information 2015 Wide RuinsExitCare, MarylandLLC. This information  is not intended to replace advice given to you by your health care provider. Make sure you discuss any questions you have with your health care provider.  Tos - Adultos  (Cough, Adult)  La tos es un reflejo que ayuda a limpiar las vas areas y Administratorla garganta. Puede ayudar a curar el organismo o ser Neomia Dearuna reaccin a un irritante. La tos puede durar entre 2  3 semanas (aguda) o puede durar ms de 8 semanas (crnica)  CAUSAS  Tos aguda:  6. Infecciones virales o bacterianas. Tos crnica.   Infecciones.  Alergias.  Asma.  Goteo post nasal.  El hbito de fumar.  Acidez o reflujo gstrico.  Algunos medicamentos.  Problemas pulmonares crnicos  Cncer. SNTOMAS   Tos.  Grant RutsFiebre.  Dolor en el pecho.  Aumento en el ritmo respiratorio.  Ruidos agudos al respirar (sibilancias).  Moco coloreado al toser (esputo). TRATAMIENTO   Un tos de causa bacteriana puede tratarse con antibiticos.  La tos de origen viral debe seguir su curso y no responde a los antibiticos.  El mdico podr recomendar otros tratamientos si tiene tos crnica. INSTRUCCIONES PARA EL CUIDADO EN EL HOGAR   Solo tome medicamentos que se pueden comprar sin receta o recetados para Chief Technology Officerel dolor, Dentistmalestar o fiebre, como le indica el mdico. Utilice antitusivos slo en la forma indicada por el mdico.  Use un vaporizador o humidificador de niebla fra en la habitacin para ayudar a aflojar las secreciones.  Duerma en posicin semi erguida si la tos empeora por la noche.  Descanse todo lo que pueda.  Si fuma, abandone el hbito. SOLICITE ATENCIN MDICA DE INMEDIATO SI:   Observa pus en el esputo.  La tos empeora.  No puede controlar la tos con antitusivos y no puede dormir debido a Secretary/administratorello.  Comienza a escupir sangre al toser.  Tiene dificultad para respirar.  El dolor empeora o no puede controlarlo con los medicamentos.  Tiene fiebre. ASEGRESE DE QUE:   Comprende estas instrucciones.  Controlar su  enfermedad.  Solicitar ayuda de inmediato si no mejora o si empeora. Document Released: 10/19/2010 Document Revised: 06/05/2011 [facility] RehabilitationExitCare Patient Information 2015 BishopExitCare, MarylandLLC. This information is not intended to replace advice given to you by your health care provider. Make sure you discuss any questions you have with your health care provider.  Depresin (Depression) La depresin es un sentimiento de tristeza, decaimiento, sufrimiento espiritual, melancola, pesimismo o vaco. En general, hay dos tipos de depresin: 7. Tristeza o afliccin normal. Todos tenemos este tipo de depresin de vez en cuando, despus de atravesar alguna experiencia triste, como la prdida de un trabajo o el final de Administrator, artsuna relacin. Este tipo de depresin se considera normal, es de corta duracin y se Sport and exercise psychologistresuelve en cuestin de unos Hartford Financialpocos das a 2 semanas. La depresin correspondiente a la prdida de un ser querido (duelo) a menudo dura ms que 2semanas, pero normalmente mejora con el Vacavilletiempo. 8. Depresin clnica. Este tipo de depresin dura ms que la tristeza o afliccin normal, o interfiere en su capacidad de funcionar en el hogar, en el trabajo y en la escuela. Tambin interfiere en las relaciones personales. Afecta casi todos  los aspectos de la vida. La depresin clnica es una enfermedad. Los sntomas de la depresin tambin pueden tener causas diferentes a las mencionadas arriba, por ejemplo:  Enfermedades fsicas. Algunas enfermedades fsicas, como hipotiroidismo, anemia grave, tipos especficos de cncer, diabetes, convulsiones incontroladas, problemas cardacos y pulmonares, ictus y Engineer, mining crnico, se asocian con frecuencia a los sntomas de la depresin.  Efectos secundarios de algunos medicamentos recetados. En algunas personas, determinados tipos de medicamentos pueden causar sntomas de depresin.  Consumo de drogas. El consumo de alcohol y drogas puede causar sntomas de depresin. SNTOMAS Los sntomas de  tristeza y duelo normal son:  Lolita Lenz o llorar durante perodos cortos de [place].  Falta de preocupacin por todo (apata).  Dificultad para dormir o dormir demasiado.  No poder disfrutar de las cosas que antes disfrutaba.  El deseo de estar solo todo el tiempo (aislamiento social).  Falta de energa o motivacin.  Dificultad para concentrarse o recordar.  Cambios en el apetito o en el peso.  Inquietud o agitacin. Los sntomas de la depresin [place] son los mismos de la tristeza o duelo normal y tambin Baxter International siguientes sntomas:  Sentirse triste o llorar todo el [place].  Sentimientos de culpa o inutilidad.  Sentimientos de desesperanza o desamparo.  Pensamientos de suicidio o el deseo de daarse a s mismo (ideas suicidas).  Prdida de contacto con la realidad (sntomas psicticos). Ver o escuchar cosas que no son reales (alucinaciones) o tener creencias falsas acerca de su vida o de las personas que lo rodean (delirios y paranoia). DIAGNSTICO El diagnstico de depresin clnica por lo general est basado en la gravedad y la duracin de los sntomas. El mdico le har preguntas sobre su historia clnica y [place] de sustancias, para determinar si la causa de la depresin es una enfermedad fsica, el uso de medicamentos recetados o el abuso de sustancias. El mdico tambin podr indicarle anlisis de [place]. TRATAMIENTO Por lo general, la tristeza y la afliccin normal no requieren tratamiento. Pero a veces se indican antidepresivos durante el duelo para [employer] sntomas de depresin hasta que se resuelvan. El tratamiento de la depresin clnica depende de la gravedad de los sntomas, pero suele incluir antidepresivos, psicoterapia con un profesional de la salud mental o una combinacin de [place]. El mdico lo ayudar a Leisure centre manager tratamiento para usted. La depresin causada por una enfermedad fsica generalmente desaparece con tratamiento mdico  adecuado de la enfermedad. Si un medicamento recetado le causa depresin, consulte al mdico si debe suspenderlo, disminuir la dosis o sustituirlo por otro medicamento. La depresin causada por el abuso de alcohol o de drogas desaparece al dejar de usar estas sustancias. Algunos adultos necesitan ayuda profesional para dejar de beber o de consumir drogas. SOLICITE ATENCIN MDICA DE INMEDIATO SI:  Tiene pensamientos acerca de lastimarse o daar a Economist.  Pierde el contacto con la realidad (tiene sntomas psicticos).  Est tomando medicamentos para la depresin y tiene efectos secundarios graves. Irven Shelling MS INFORMACIN  National Alliance on Mental Illness: www.nami.[employer] of Mental Health: http://www.maynard.net/ Document Released: 03/13/2005 Document Revised: 07/28/2013 [facility] Patient Information 2015 [place], [place]. This information is not intended to replace advice given to you by your health care provider. Make sure you discuss any questions you have with your health care provider.

## 2014-07-30 NOTE — Progress Notes (Signed)
Subjective:    Patient ID: Anthony Kirby, male    DOB: Oct 01, 1975, 39 y.o.   MRN: [id number] This chart was scribed for [name], MD by [place] Peace, ED Scribe. The patient was seen in [facility]. The patient's care was started at 10:41 AM.  Chief Complaint  Patient presents with  . Follow-up    Chest congestion    HPI  Pt is here for follow-up. He was seen by Dr. Neva Seat on 4/29. He had cough and congestion for 1 month with epigastric pain and sour taste. Symptoms believed to be a combination of LPR, allergic cause, with drainage and possible bronchitis. Advised treatment with OTC Claritin, Mucinex, and  Omeprazole and avoid triggers. If cough was not improved in 1 week, advised to start Z-pack.   HPI Comments: Anthony Kirby is a 39 y.o. male who presents to the [facility] complaining of chest congestion that he experiences when he lays down but reports symptoms have gotten better. He now complains of minimal allergies. Pt is not taking Omeprazole but did start taking Azithromycin which he has finished.   Pt brings up history of depression- Grandfather passed away 2 years ago. Pt reports he feels as if he just has a lot on his mind. He notes some trouble sleeping in the past. Pt has been taking Alutruline 50 mg. No complaints of SI. He denies any side effects with Zolaf, reports he felt symptoms were better while he was on medication. He further denies any dark or tarry stools or vomiting. He goes on to report experiencing occassional heart burn. Pt states he is a non-smoker and does not drink alcohol.     There are no active problems to display for this patient.  Past Medical History  Diagnosis Date  . Gastritis    History reviewed. No pertinent past surgical history. Allergies  Allergen Reactions  . Penicillins Hives   Prior to Admission medications   Medication Sig Start Date End Date Taking? Authorizing Provider  azithromycin (ZITHROMAX) 250 MG tablet Take 2 pills by mouth on day 1,  then 1 pill by mouth per day on days 2 through 5. Patient not taking: Reported on 07/30/2014 07/24/14   Shade Flood, MD   History   Social History  . Marital Status: Single    Spouse Name: N/A  . Number of Children: N/A  . Years of Education: N/A   Occupational History  . Not on file.   Social History Main Topics  . Smoking status: Never Smoker   . Smokeless tobacco: Not on file  . Alcohol Use: No  . Drug Use: Not on file  . Sexual Activity: Not on file   Other Topics Concern  . Not on file   Social History Narrative     Review of Systems  HENT: Positive for congestion.   Respiratory: Positive for cough.   Gastrointestinal: Negative for vomiting, abdominal pain and blood in stool.  Psychiatric/Behavioral: Negative for suicidal ideas.       Objective:   Physical Exam  Constitutional: He is oriented to person, place, and time. He appears well-developed and well-nourished.  HENT:  Head: Normocephalic and atraumatic.  Eyes: EOM are normal. Pupils are equal, round, and reactive to light.  Neck: No JVD present. Carotid bruit is not present.  Cardiovascular: Normal rate, regular rhythm and normal heart sounds.   No murmur heard. Pulmonary/Chest: Effort normal and breath sounds normal. He has no rales.  Abdominal: Soft. There is no tenderness at  McBurney's point and negative Murphy's sign.  Musculoskeletal: He exhibits no edema.  Neurological: He is alert and oriented to person, place, and time.  Skin: Skin is warm and dry.  Psychiatric: He has a normal mood and affect.  Vitals reviewed.   Filed Vitals:   07/30/14 0926  BP: 140/90  Pulse: 76  Temp: 98.1 F (36.7 C)  TempSrc: Oral  Resp: 20  Height:  (1.676 m)  Weight: 158 lb 6.4 oz (71.85 kg)  SpO2: 98%     10:53 AM- Treatment plan was discussed with patient who verbalizes understanding and agrees.      Assessment & Plan:   Anthony Kirby is a 39 y.o. male Gastroesophageal reflux disease,  esophagitis presence not specified - Plan: omeprazole (PRILOSEC) 20 MG capsule, Laryngopharyngeal reflux (LPR) - Plan: omeprazole (PRILOSEC) 20 MG capsule  -trigger avoidance, foods listed on handout. Start omeprazole. If cough persists, or acid reflux sx's not resolving in next month - rtc for recheck.  Sooner if worse.   Depression - Plan: sertraline (ZOLOFT) 50 MG tablet  - stable prior on zoloft - restarted. Recheck for status in 6 months.   Cough - Plan: omeprazole (PRILOSEC) 20 MG capsule  - as above. Improving. rtc if not resolved in next month  rtc precautions discussed and spanish spoken with understanding expressed.   Meds ordered this encounter  Medications  . omeprazole (PRILOSEC) 20 MG capsule    Sig: Take 1 capsule (20 mg total) by mouth daily.    Dispense:  30 capsule    Refill:  5  . sertraline (ZOLOFT) 50 MG tablet    Sig: Take 1 tablet (50 mg total) by mouth daily.    Dispense:  30 tablet    Refill:  5   Patient Instructions  Omeprazole cada dia por acido. Sertraline cada dia por depression. si su acido no mejor en una mes - regrese.  regrese en 6 meses por depresion. Mas temprano si empeorse  Reflujo gastroesofgico - Adultos  (Gastroesophageal Reflux Disease, Adult)  El reflujo gastroesofgico ocurre cuando el cido del estmago pasa al esfago. Cuando el cido entra en contacto con el esfago, el cido provoca dolor (inflamacin) en el esfago. Con el tiempo, pueden formarse pequeos agujeros (lceras) en el revestimiento del esfago. CAUSAS  1. Exceso de Runner, broadcasting/film/video. Esto aplica presin [employer] and [employer], lo que hace que el cido del estmago suba hacia el esfago. 2. El hbito de fumar Aumenta la produccin de cido en el [place]. 3. El consumo de alcohol. Provoca disminucin de la presin en el esfnter esofgico inferior (vlvula o anillo de msculo entre el esfago y Investment banker, corporate), permitiendo que el cido del estmago suba hacia el esfago. 4. Cenas a  ltima hora del da y estmago lleno. Aumenta la presin y la produccin de cido en el estmago. 5. Malformacin en el esfnter esofgico inferior. A menudo no se halla causa.  SNTOMAS   Ardor y Radiographer, therapeutic parte inferior del pecho detrs del esternn y en la zona media del [place]. Puede ocurrir [employer] Us por semana o ms a menudo.  Dificultad para tragar.  Dolor de Advertising copywriter.  Tos seca.  Sntomas similares al asma que incluyen sensacin de opresin en el pecho, falta de aire y sibilancias. DIAGNSTICO  El mdico diagnosticar el problema basndose en los sntomas. En algunos casos, se indican radiografas y otras pruebas para verificar si hay complicaciones o para comprobar el estado del [address] y Training and development officer.  TRATAMIENTO  El mdico le indicar medicamentos de venta libre o recetados para ayudar a disminuir la produccin de cido. Consulte con su mdico antes de Corporate investment bankerempezar o agregar cualquier medicamento nuevo.  INSTRUCCIONES PARA EL CUIDADO EN EL HOGAR   Modifique los factores que pueda cambiar. Consulte con su mdico para solicitar orientacin relacionada con la prdida de peso, dejar de fumar y el consumo de alcohol.  Evite las comidas y bebidas que [address] Avenueempeoran los Wimerproblemas, [place]:  MinnesotaBebidas con cafena o alcohlicas.  Chocolate.  Sabores a Advertising account plannermenta.  Ajo y cebolla.  Comidas muy condimentadas.  Ctricos como naranjas, limones o limas.  Alimentos que contengan tomate, como salsas, [place] y pizza.  Alimentos fritos y Lexicographergrasos.  Evite acostarse durante 3 horas antes de irse a dormir o antes de tomar una siesta.  Haga comidas pequeas durante Glass blower/designerel da en lugar de 3 comidas abundantes.  Use ropas sueltas. No use nada apretado alrededor de la cintura que cause presin en el estmago.  Levante (eleve) la cabecera de la cama 6 a 8 pulgadas (15 a 20 cm) con bloques de madera. Usar almohadas extra no ayuda.  Solo tome medicamentos que se pueden comprar sin receta o recetados para el  dolor, Dentistmalestar o fiebre, como le indica el mdico.  No tome aspirina, ibuprofeno ni antiinflamatorios no esteroides. SOLICITE ATENCIN MDICA DE Engelhard CorporationNMEDIATO SI:   Goldman SachsSiente dolor en los brazos, el cuello, la Boazmandbula, los dientes o la espalda.  El dolor aumenta o cambia la intensidad o la durancin.  Tiene nuseas, vmitos o sudoracin(diaforesis).  Siente falta de aire o dolor en el pecho, o se desmaya.  Vomita y el vmito tiene Coyvillesangre, es de color [place], Mobeetieamarillo, negro o es similar a la borra del caf o tiene Blue Eyesangre.  Las heces son rojas, sanguinolentas o negras. Estos sntomas pueden ser signos de [address] - [address]otros problemas, como enfermedades cardacas, hemorragias gstrias o sangrado esofgico.  ASEGRESE DE QUE:   Comprende estas instrucciones.  Controlar su enfermedad.  Solicitar ayuda de inmediato si no mejora o si empeora. Document Released: 12/21/2004 Document Revised: 06/05/2011 [facility]Exit[facility] Patient Information 2015 VandergriftExitCare, MarylandLLC. This information is not intended to replace advice given to you by your health care provider. Make sure you discuss any questions you have with your health care provider.  Tos - Adultos  (Cough, Adult)  La tos es un reflejo que ayuda a limpiar las vas areas y Administratorla garganta. Puede ayudar a curar el organismo o ser Neomia Dearuna reaccin a un irritante. La tos puede durar entre 2  3 semanas (aguda) o puede durar ms de 8 semanas (crnica)  CAUSAS  Tos aguda:  6. Infecciones virales o bacterianas. Tos crnica.   Infecciones.  Alergias.  Asma.  Goteo post nasal.  El hbito de fumar.  Acidez o reflujo gstrico.  Algunos medicamentos.  Problemas pulmonares crnicos  Cncer. SNTOMAS   Tos.  Grant RutsFiebre.  Dolor en el pecho.  Aumento en el ritmo respiratorio.  Ruidos agudos al respirar (sibilancias).  Moco coloreado al toser (esputo). TRATAMIENTO   Un tos de causa bacteriana puede tratarse con antibiticos.  La tos de origen viral debe seguir su  curso y no responde a los antibiticos.  El mdico podr recomendar otros tratamientos si tiene tos crnica. INSTRUCCIONES PARA EL CUIDADO EN EL HOGAR   Solo tome medicamentos que se pueden comprar sin receta o recetados para Chief Technology Officerel dolor, Dentistmalestar o fiebre, como le indica el mdico. Utilice antitusivos slo en la forma indicada por el mdico.  Use un vaporizador o humidificador de niebla fra en la habitacin para ayudar a aflojar las secreciones.  Duerma en posicin semi erguida si la tos empeora por la noche.  Descanse todo lo que pueda.  Si fuma, abandone el hbito. SOLICITE ATENCIN MDICA DE INMEDIATO SI:   Observa pus en el esputo.  La tos empeora.  No puede controlar la tos con antitusivos y no puede dormir debido a Secretary/administrator.  Comienza a escupir sangre al toser.  Tiene dificultad para respirar.  El dolor empeora o no puede controlarlo con los medicamentos.  Tiene fiebre. ASEGRESE DE QUE:   Comprende estas instrucciones.  Controlar su enfermedad.  Solicitar ayuda de inmediato si no mejora o si empeora. Document Released: 10/19/2010 Document Revised: 06/05/2011 [facility] Patient Information 2015 [place], [place]. This information is not intended to replace advice given to you by your health care provider. Make sure you discuss any questions you have with your health care provider.  Depresin (Depression) La depresin es un sentimiento de tristeza, decaimiento, sufrimiento espiritual, melancola, pesimismo o vaco. En general, hay dos tipos de depresin: 7. Tristeza o afliccin normal. Todos tenemos este tipo de depresin de vez en cuando, despus de atravesar alguna experiencia triste, como la prdida de un trabajo o el final de Administrator, arts. Este tipo de depresin se considera normal, es de corta duracin y se Sport and exercise psychologist en cuestin de unos [employer] a 2 semanas. La depresin correspondiente a la prdida de un ser querido (duelo) a menudo dura ms que 2semanas, pero  normalmente mejora con el [place]. 8. Depresin clnica. Este tipo de depresin dura ms que la tristeza o afliccin normal, o interfiere en su capacidad de funcionar en el hogar, en el trabajo y en la escuela. Tambin interfiere en las relaciones personales. Afecta casi todos los aspectos de la vida. La depresin clnica es una enfermedad. Los sntomas de la depresin tambin pueden tener causas diferentes a las mencionadas arriba, por ejemplo:  Enfermedades fsicas. Algunas enfermedades fsicas, como hipotiroidismo, anemia grave, tipos especficos de cncer, diabetes, convulsiones incontroladas, problemas cardacos y pulmonares, ictus y Engineer, mining crnico, se asocian con frecuencia a los sntomas de la depresin.  Efectos secundarios de algunos medicamentos recetados. En algunas personas, determinados tipos de medicamentos pueden causar sntomas de depresin.  Consumo de drogas. El consumo de alcohol y drogas puede causar sntomas de depresin. SNTOMAS Los sntomas de tristeza y duelo normal son:  Lolita Lenz o llorar durante perodos cortos de [place].  Falta de preocupacin por todo (apata).  Dificultad para dormir o dormir demasiado.  No poder disfrutar de las cosas que antes disfrutaba.  El deseo de estar solo todo el tiempo (aislamiento social).  Falta de energa o motivacin.  Dificultad para concentrarse o recordar.  Cambios en el apetito o en el peso.  Inquietud o agitacin. Los sntomas de la depresin [place] son los mismos de la tristeza o duelo normal y tambin Baxter International siguientes sntomas:  Sentirse triste o llorar todo el [place].  Sentimientos de culpa o inutilidad.  Sentimientos de desesperanza o desamparo.  Pensamientos de suicidio o el deseo de daarse a s mismo (ideas suicidas).  Prdida de contacto con la realidad (sntomas psicticos). Ver o escuchar cosas que no son reales (alucinaciones) o tener creencias falsas acerca de su vida o de las personas  que lo rodean (delirios y paranoia). DIAGNSTICO El diagnstico de depresin clnica por lo general est basado en la gravedad y la duracin de los sntomas. El [facility]  preguntas sobre su historia clnica y Brandonvilleel uso de sustancias, para determinar si la causa de la depresin es una enfermedad fsica, el uso de medicamentos recetados o el abuso de sustancias. El mdico tambin podr indicarle anlisis de Crosbysangre. TRATAMIENTO Por lo general, la tristeza y la afliccin normal no requieren tratamiento. Pero a veces se indican antidepresivos durante el duelo para Eastman Kodakaliviar los sntomas de depresin hasta que se resuelvan. El tratamiento de la depresin clnica depende de la gravedad de los sntomas, pero suele incluir antidepresivos, psicoterapia con un profesional de la salud mental o una combinacin de Blufftonambos. El mdico lo ayudar a Leisure centre managerdeterminar el mejor tratamiento para usted. La depresin causada por una enfermedad fsica generalmente desaparece con tratamiento mdico adecuado de la enfermedad. Si un medicamento recetado le causa depresin, consulte al mdico si debe suspenderlo, disminuir la dosis o sustituirlo por otro medicamento. La depresin causada por el abuso de alcohol o de drogas desaparece al dejar de usar estas sustancias. Algunos adultos necesitan ayuda profesional para dejar de beber o de consumir drogas. SOLICITE ATENCIN MDICA DE INMEDIATO SI:  Tiene pensamientos acerca de lastimarse o daar a Economistotras personas.  Pierde el contacto con la realidad (tiene sntomas psicticos).  Est tomando medicamentos para la depresin y tiene efectos secundarios graves. Irven ShellingPARA OBTENER MS INFORMACIN  National Alliance on Mental Illness: www.nami.AK Steel Holding Corporationorg  National Institute of Mental Health: http://www.maynard.net/www.nimh.nih.gov Document Released: 03/13/2005 Document Revised: 07/28/2013 [facility] Patient Information 2015 ValentineExitCare, MarylandLLC. This information is not intended to replace advice given to you by your health care  provider. Make sure you discuss any questions you have with your health care provider.      I personally performed the services described in this documentation, which was scribed in my presence. The recorded information has been reviewed and considered, and addended by me as needed.

## 2014-08-01 ENCOUNTER — Encounter: Payer: Self-pay | Admitting: Family Medicine

## 2014-10-09 ENCOUNTER — Ambulatory Visit (INDEPENDENT_AMBULATORY_CARE_PROVIDER_SITE_OTHER): Payer: Commercial Managed Care - HMO | Admitting: Physician Assistant

## 2014-10-09 VITALS — BP 110/73 | HR 64 | Temp 97.9°F | Resp 16 | Ht 65.75 in | Wt 159.6 lb

## 2014-10-09 DIAGNOSIS — R1013 Epigastric pain: Secondary | ICD-10-CM | POA: Diagnosis not present

## 2014-10-09 DIAGNOSIS — F329 Major depressive disorder, single episode, unspecified: Secondary | ICD-10-CM

## 2014-10-09 DIAGNOSIS — K2100 Gastro-esophageal reflux disease with esophagitis, without bleeding: Secondary | ICD-10-CM

## 2014-10-09 DIAGNOSIS — F32A Depression, unspecified: Secondary | ICD-10-CM

## 2014-10-09 DIAGNOSIS — R5383 Other fatigue: Secondary | ICD-10-CM | POA: Diagnosis not present

## 2014-10-09 DIAGNOSIS — G4700 Insomnia, unspecified: Secondary | ICD-10-CM | POA: Diagnosis not present

## 2014-10-09 DIAGNOSIS — G8929 Other chronic pain: Secondary | ICD-10-CM

## 2014-10-09 LAB — CBC WITH DIFFERENTIAL/PLATELET
Basophils Absolute: 0 10*3/uL (ref 0.0–0.1)
Basophils Relative: 0 % (ref 0–1)
Eosinophils Absolute: 0.2 10*3/uL (ref 0.0–0.7)
Eosinophils Relative: 3 % (ref 0–5)
HCT: 47.2 % (ref 39.0–52.0)
Hemoglobin: 16.4 g/dL (ref 13.0–17.0)
Lymphocytes Relative: 41 % (ref 12–46)
Lymphs Abs: 2.1 10*3/uL (ref 0.7–4.0)
MCH: 31 pg (ref 26.0–34.0)
MCHC: 34.7 g/dL (ref 30.0–36.0)
MCV: 89.2 fL (ref 78.0–100.0)
MPV: 9.8 fL (ref 8.6–12.4)
Monocytes Absolute: 0.4 10*3/uL (ref 0.1–1.0)
Monocytes Relative: 8 % (ref 3–12)
Neutro Abs: 2.4 10*3/uL (ref 1.7–7.7)
Neutrophils Relative %: 48 % (ref 43–77)
Platelets: 237 10*3/uL (ref 150–400)
RBC: 5.29 MIL/uL (ref 4.22–5.81)
RDW: 12.8 % (ref 11.5–15.5)
WBC: 5.1 10*3/uL (ref 4.0–10.5)

## 2014-10-09 LAB — COMPLETE METABOLIC PANEL WITH GFR
ALT: 59 U/L — ABNORMAL HIGH (ref 0–53)
AST: 27 U/L (ref 0–37)
Albumin: 4.5 g/dL (ref 3.5–5.2)
Alkaline Phosphatase: 56 U/L (ref 39–117)
BUN: 12 mg/dL (ref 6–23)
CO2: 28 mEq/L (ref 19–32)
Calcium: 9.4 mg/dL (ref 8.4–10.5)
Chloride: 103 mEq/L (ref 96–112)
Creat: 0.66 mg/dL (ref 0.50–1.35)
GFR, Est African American: >89 mL/min
GFR, Est Non African American: >89 mL/min
Glucose, Bld: 104 mg/dL — ABNORMAL HIGH (ref 70–99)
Potassium: 3.9 mEq/L (ref 3.5–5.3)
Sodium: 141 mEq/L (ref 135–145)
Total Bilirubin: 0.8 mg/dL (ref 0.2–1.2)
Total Protein: 6.9 g/dL (ref 6.0–8.3)

## 2014-10-09 MED ORDER — RANITIDINE HCL 150 MG PO TABS: 150.0000 mg | ORAL_TABLET | Freq: Two times a day (BID) | ORAL | Status: DC

## 2014-10-09 MED ORDER — ESOMEPRAZOLE MAGNESIUM 40 MG PO CPDR
40.0000 mg | DELAYED_RELEASE_CAPSULE | Freq: Every day | ORAL | Status: DC
Start: 2014-10-09 — End: 2015-01-16

## 2014-10-09 MED ORDER — TRAZODONE HCL 50 MG PO TABS: 25.0000 mg | ORAL_TABLET | Freq: Every evening | ORAL | Status: DC | PRN

## 2014-10-09 MED ORDER — ESCITALOPRAM OXALATE 10 MG PO TABS
10.0000 mg | ORAL_TABLET | Freq: Every day | ORAL | Status: DC
Start: 2014-10-09 — End: 2015-01-16

## 2014-10-09 NOTE — Progress Notes (Signed)
Anthony BaldingMorris Kirby  MRN: [id number] DOB: [birth year]  Subjective:  Pt presents to clinic with epigastric pain for about 3 years.  He wakes up in the am with a sour taste in his mouth.  Was put on prilosec on 5/16 and he took it a month and he noticed no difference in his symptoms.  The pain is worse after spicy or greasy foods. He does not know if it it different when he lays down or not.  He has tried nothing else at home.  He also did not take the zoloft.  He started it and in about 10 day he stopped it due to side effects - he stated it messed with his mind and made him feel worse.  He had been on Zoloft in the past and it helped but that was about a year ago. He still has depressed mood and decreased energy and lack of interest.  He states he is tired all the time.  He does not sleep well at night - has trouble falling asleep because his mind will not turn off - worries a lot.  No smoke, ETOH, drugs.  There are no active problems to display for this patient.   No current outpatient prescriptions on file prior to visit.   No current facility-administered medications on file prior to visit.    Allergies  Allergen Reactions  . Penicillins Hives    Review of Systems  Psychiatric/Behavioral: Positive for sleep disturbance and dysphoric mood. Negative for suicidal ideas and self-injury. The patient is nervous/anxious (esp at night prior to sleep).    Objective:  BP 110/73 mmHg  Pulse 64  Temp(Src) 97.9 F (36.6 C) (Oral)  Resp 16  Ht 5' 5.75" (1.67 m)  Wt 159 lb 9.6 oz (72.394 kg)  BMI 25.96 kg/m2  SpO2 99%  Physical Exam  Constitutional: He is oriented to person, place, and time and well-developed, well-nourished, and in no distress.  HENT:  Head: Normocephalic and atraumatic.  Right Ear: External ear normal.  Left Ear: External ear normal.  Eyes: Conjunctivae are normal.  Neck: Normal range of motion.  Cardiovascular: Normal rate, regular rhythm and normal heart sounds.   No  murmur heard. Pulmonary/Chest: Effort normal and breath sounds normal. He has no wheezes.  Abdominal: Bowel sounds are normal. There is tenderness (epigastric). There is no rebound and no guarding.  Neurological: He is alert and oriented to person, place, and time. Gait normal.  Skin: Skin is warm and dry.  Psychiatric: Memory, affect and judgment normal. He exhibits a depressed mood.    Assessment and Plan :  Abdominal pain, chronic, epigastric - Plan: H. pylori breath test, CBC with Differential/Platelet, COMPLETE METABOLIC PANEL WITH GFR  Gastroesophageal reflux disease with esophagitis - Plan: esomeprazole (NEXIUM) 40 MG capsule, ranitidine (ZANTAC) 150 MG tablet  Depression - Plan: escitalopram (LEXAPRO) 10 MG tablet  Insomnia - Plan: traZODone (DESYREL) 50 MG tablet  Other fatigue - Plan: TSH   Pt has depression and I am not sure why the zoloft caused him side effects but he is interested and willing to try a different antidepressant - we will try lexapro we talked abut possible transient side effects and the fact that it will probably take 4-6 weeks for full effect of the medication.  We will also give him trazodone for sleep because I suspect that his fatigue is related to his lack of sleep and it will definitely affect his depression.  We will treat for GERD while we  are waiting on his H pylori test results.  He will recheck in 4 weeks.    Benny Lennert PA-C  [facility] 10/09/2014 2:59 PM

## 2014-10-09 NOTE — Patient Instructions (Signed)
I will contact you with your lab results as soon as they are available.   If you have not heard from me in 2 weeks, please contact me.  The fastest way to get your results is to register for My Chart (see the instructions on the last page of this printout).   

## 2014-10-23 ENCOUNTER — Ambulatory Visit (INDEPENDENT_AMBULATORY_CARE_PROVIDER_SITE_OTHER): Payer: Commercial Managed Care - HMO | Admitting: Physician Assistant

## 2014-10-23 VITALS — BP 128/78 | HR 70 | Temp 98.7°F | Resp 16 | Ht 67.0 in | Wt 161.2 lb

## 2014-10-23 DIAGNOSIS — L723 Sebaceous cyst: Secondary | ICD-10-CM | POA: Diagnosis not present

## 2014-10-23 DIAGNOSIS — L729 Follicular cyst of the skin and subcutaneous tissue, unspecified: Secondary | ICD-10-CM | POA: Diagnosis not present

## 2014-10-23 DIAGNOSIS — L089 Local infection of the skin and subcutaneous tissue, unspecified: Secondary | ICD-10-CM

## 2014-10-23 MED ORDER — DOXYCYCLINE HYCLATE 100 MG PO CAPS: 100.0000 mg | ORAL_CAPSULE | Freq: Two times a day (BID) | ORAL | Status: DC

## 2014-10-23 NOTE — Progress Notes (Signed)
   Subjective:    Patient ID: Anthony Kirby, male    DOB: 1975-05-16, 39 y.o.   MRN: [id number]  HPI Patient presents for bump on neck that has been present for past month, however, has been more painful over the past 3 days. Bump has increased in size. Started as a pimple. Tried to stick a needle in and did not get much pus out. Denies fever or neck stiffness, but is more red in color. Med allergy to PCN.   Review of Systems  Constitutional: Negative for fever and chills.  Musculoskeletal: Negative for neck stiffness.  Skin: Positive for color change (red). Negative for pallor, rash and wound.       Bump on neck present       Objective:   Physical Exam  Constitutional: He is oriented to person, place, and time. He appears well-developed and well-nourished. No distress.  Blood pressure 128/78, pulse 70, temperature 98.7 F (37.1 C), temperature source Oral, resp. rate 16, height  (1.702 m), weight 161 lb 3.2 oz (73.12 kg), SpO2 99 %.   HENT:  Head: Normocephalic and atraumatic.  Right Ear: External ear normal.  Left Ear: External ear normal.  Eyes: Conjunctivae are normal. Right eye exhibits no discharge. Left eye exhibits no discharge. No scleral icterus.  Pulmonary/Chest: Effort normal.  Neurological: He is alert and oriented to person, place, and time.  Skin: Lesion (sebaceous cyst over hyoid bone) noted. He is not diaphoretic. There is erythema (over cyst).  Psychiatric: He has a normal mood and affect. His behavior is normal. Judgment and thought content normal.   Procedure Consent obtained. Iodine prep. 2 cc 1% lido local anesthesia. Incision made. Purulence and sebaceous material expressed. Wound culture obtained. Wound irrigated and explored. 1/4 plain packing and clean dressing placed.     Assessment & Plan:  1. Infected sebaceous cyst RTC 10/25/14 for wound care. Care instructions discussed. - Wound culture - doxycycline (VIBRAMYCIN) 100 MG capsule; Take 1 capsule  (100 mg total) by mouth 2 (two) times daily.  Dispense: 20 capsule; Refill: 0   Eathel Pajak PA-C  [facility] 10/23/2014 10:36 AM

## 2014-10-25 ENCOUNTER — Ambulatory Visit (INDEPENDENT_AMBULATORY_CARE_PROVIDER_SITE_OTHER): Payer: Commercial Managed Care - HMO | Admitting: Physician Assistant

## 2014-10-25 VITALS — BP 110/70 | HR 73 | Temp 98.5°F | Resp 16 | Ht 67.0 in | Wt 161.0 lb

## 2014-10-25 DIAGNOSIS — L723 Sebaceous cyst: Secondary | ICD-10-CM

## 2014-10-25 DIAGNOSIS — Z5189 Encounter for other specified aftercare: Secondary | ICD-10-CM

## 2014-10-25 DIAGNOSIS — L089 Local infection of the skin and subcutaneous tissue, unspecified: Secondary | ICD-10-CM

## 2014-10-25 NOTE — Progress Notes (Signed)
   Subjective:    Patient ID: Anthony Kirby, male    DOB: 26-Jun-1975, 39 y.o.   MRN: [id number]  HPI Patient returns to clinic for wound care status post I&D 2 days ago of infected sebaceous cyst. States that it has been a little painful. Denies drainage, fever, or redness. Has changed dressing and done warm compresses. Compliant with doxycycline.  Med allergy to PCN.    Review of Systems  Constitutional: Negative for fever.  Skin: Positive for wound. Negative for color change.       Objective:   Physical Exam  Constitutional: He is oriented to person, place, and time. He appears well-developed and well-nourished. No distress.  Blood pressure 110/70, pulse 73, temperature 98.5 F (36.9 C), temperature source Oral, resp. rate 16, height  (1.702 m), weight 161 lb (73.029 kg), SpO2 98 %.   HENT:  Head: Normocephalic and atraumatic.  Right Ear: External ear normal.  Left Ear: External ear normal.  Eyes: Conjunctivae are normal. Right eye exhibits no discharge. Left eye exhibits no discharge. No scleral icterus.  Pulmonary/Chest: Effort normal.  Neurological: He is alert and oriented to person, place, and time.  Skin: Skin is warm and dry. No rash noted. He is not diaphoretic. No erythema. No pallor.  Dressing and packing intact. Upon removal, no purulence expressed. No necrotic tissue present. Irrigated and explored wound. Outside wound larger than depth of wound.   Psychiatric: He has a normal mood and affect. His behavior is normal. Judgment and thought content normal.      Assessment & Plan:  1. Infected sebaceous cyst 2. Encounter for wound care Wound healed well. Continue doxycycline and wound compresses. Keep wound covered until completely closed. No additional follow up necessary.    Janan Ridge PA-C  [facility] 10/25/2014 10:49 AM

## 2014-10-26 LAB — WOUND CULTURE
Gram Stain: NONE SEEN
Gram Stain: NONE SEEN
Organism ID, Bacteria: NO GROWTH

## 2015-01-16 ENCOUNTER — Ambulatory Visit (INDEPENDENT_AMBULATORY_CARE_PROVIDER_SITE_OTHER): Payer: Commercial Managed Care - HMO | Admitting: Family Medicine

## 2015-01-16 VITALS — BP 106/70 | HR 75 | Temp 98.8°F | Resp 20 | Ht 67.0 in | Wt 161.6 lb

## 2015-01-16 DIAGNOSIS — R519 Headache, unspecified: Secondary | ICD-10-CM

## 2015-01-16 DIAGNOSIS — R945 Abnormal results of liver function studies: Secondary | ICD-10-CM

## 2015-01-16 DIAGNOSIS — R059 Cough, unspecified: Secondary | ICD-10-CM

## 2015-01-16 DIAGNOSIS — R05 Cough: Secondary | ICD-10-CM | POA: Diagnosis not present

## 2015-01-16 DIAGNOSIS — K2100 Gastro-esophageal reflux disease with esophagitis, without bleeding: Secondary | ICD-10-CM

## 2015-01-16 DIAGNOSIS — R1012 Left upper quadrant pain: Secondary | ICD-10-CM

## 2015-01-16 DIAGNOSIS — R51 Headache: Secondary | ICD-10-CM | POA: Diagnosis not present

## 2015-01-16 DIAGNOSIS — F32A Depression, unspecified: Secondary | ICD-10-CM

## 2015-01-16 DIAGNOSIS — K219 Gastro-esophageal reflux disease without esophagitis: Secondary | ICD-10-CM | POA: Diagnosis not present

## 2015-01-16 DIAGNOSIS — F329 Major depressive disorder, single episode, unspecified: Secondary | ICD-10-CM

## 2015-01-16 DIAGNOSIS — R7989 Other specified abnormal findings of blood chemistry: Secondary | ICD-10-CM | POA: Diagnosis not present

## 2015-01-16 LAB — COMPREHENSIVE METABOLIC PANEL
ALT: 65 U/L — ABNORMAL HIGH (ref 9–46)
AST: 26 U/L (ref 10–40)
Albumin: 4.9 g/dL (ref 3.6–5.1)
Alkaline Phosphatase: 64 U/L (ref 40–115)
BUN: 11 mg/dL (ref 7–25)
CO2: 29 mmol/L (ref 20–31)
Calcium: 10.4 mg/dL — ABNORMAL HIGH (ref 8.6–10.3)
Chloride: 100 mmol/L (ref 98–110)
Creat: 0.84 mg/dL (ref 0.60–1.35)
Glucose, Bld: 104 mg/dL — ABNORMAL HIGH (ref 65–99)
Potassium: 4 mmol/L (ref 3.5–5.3)
Sodium: 139 mmol/L (ref 135–146)
Total Bilirubin: 0.8 mg/dL (ref 0.2–1.2)
Total Protein: 7.6 g/dL (ref 6.1–8.1)

## 2015-01-16 LAB — POCT CBC
Granulocyte percent: 62 %G (ref 37–80)
HCT, POC: 49 % (ref 43.5–53.7)
Hemoglobin: 17.1 g/dL (ref 14.1–18.1)
Lymph, poc: 2 (ref 0.6–3.4)
MCH, POC: 31 pg (ref 27–31.2)
MCHC: 35 g/dL (ref 31.8–35.4)
MCV: 88.5 fL (ref 80–97)
MID (cbc): 0.4 (ref 0–0.9)
MPV: 6.8 fL (ref 0–99.8)
POC Granulocyte: 3.9 (ref 2–6.9)
POC LYMPH PERCENT: 31.1 %L (ref 10–50)
POC MID %: 6.9 %M (ref 0–12)
Platelet Count, POC: 229 10*3/uL (ref 142–424)
RBC: 5.53 M/uL (ref 4.69–6.13)
RDW, POC: 12 %
WBC: 6.3 10*3/uL (ref 4.6–10.2)

## 2015-01-16 LAB — LIPASE: Lipase: 21 U/L (ref 7–60)

## 2015-01-16 MED ORDER — ESOMEPRAZOLE MAGNESIUM 40 MG PO CPDR: 40.0000 mg | DELAYED_RELEASE_CAPSULE | Freq: Every day | ORAL | Status: DC

## 2015-01-16 MED ORDER — FLUOXETINE HCL 20 MG PO TABS: 20.0000 mg | ORAL_TABLET | Freq: Every day | ORAL | Status: DC

## 2015-01-16 NOTE — Patient Instructions (Addendum)
Por estomago/acido - Nexium cada dia, y voy a referir a specialista.   Prozac cada dia por depression.  A veces esta medicina ayuda dolor de [place].  Regrese en 3-4 semanas habalr mas de su medicina. Si dolor de cabeza no Building control surveyor - voy a referir a Hydrographic surveyor de cabeza. regrese aqui o cuarto de emergencia si empeorse.   Counseling: Di Kindle: [phone] Hilma Favors: [phone]   Trastorno depresivo mayor (Major Depressive Disorder) El trastorno depresivo mayor es una enfermedad mental. Tambin se llamado depresin clnica o depresin unipolar. Produce sentimientos de tristeza, desesperanza o desamparo. Algunas personas con trastorno depresivo mayor no se sienten particularmente tristes, pero pierden Press photographer las cosas que solan disfrutar (anhedonia). Tambin puede causar sntomas fsicos. Interfiere en el trabajo, la escuela, las relaciones y otras actividades diarias normales. Puede variar en gravedad, pero es ms duradera y ms grave que la tristeza que todos sentimos de vez en cuando en nuestras vidas.  Muchas veces es desencadenada por sucesos estresantes o cambios importantes en la vida. Algunos ejemplos de estos factores desencadenantes son el divorcio, la prdida del trabajo o el hogar, [place], y la muerte de un familiar o amigo cercano. A veces aparece sin ninguna razn evidente. Las personas que tienen familiares con depresin mayor o con trastorno bipolar tienen ms riesgo de desarrollar depresin mayor con o sin factores de Librarian, academic. Puede ocurrir a Actuary. Puede ocurrir slo una vez en su vida(episodio nico de trastorno depresivo mayor). Puede ocurrir varias veces (trastorno depresivo mayor recurrente).  SNTOMAS  Las personas con trastorno depresivo mayor presentan anhedonia o estado de nimo deprimido casi [employer] durante al menos 2 semanas o ms. Los sntomas son:   Sensacin de tristeza Designer, jewellery) o vaco.  Sentimientos de  desesperanza o desamparo.  Lagrimeo o episodios de llanto ( es observado por los dems).  Irritabilidad (en nios y adolescentes). Adems del estado de nimo deprimido o la anhedonia o ambos, estos enfermos tienen al menos cuatro de los siguientes sntomas :   Dificultad para dormir o Engineer, site.   Cambio significativo (aumento o disminucin) en el apetito o Education officer, community.   Falta de energa o motivacin.  Sentimientos de culpa o desvalorizacin.   Dificultad para concentrarse, recordar o tomar decisiones.  Movimientos inusualmente lentos (retardo psicomotor retardation) o inquietud (segn lo observado por los dems).   Deseos recurrentes de muerte, pensamientos recurrentes de autoagresin (suicidio) o intento de suicidio. Las personas con trastorno depresivo mayor suelen tener pensamientos persistentes negativos acerca de s mismos, de otras personas y del mundo. Las personas con trastorno depresivo mayor grave pueden experimentar creencias o percepciones distorsionadas sobre el mundo (delirios psicticos). Tambin pueden ver u or cosas que no son reales(alucinaciones psicticas).  DIAGNSTICO  El diagnstico se realiza mediante una evaluacin hecha por el mdico. El mdico le preguntar acerca de los aspectos de su vida cotidiana, como el [place] de nimo, el sueo y el apetito, para ver si usted tiene los sntomas de Animator. Le har preguntas sobre su historial mdico y el consumo de alcohol o drogas, incluyendo medicamentos recetados. [employer] un examen fsico y Education administrator anlisis de [place]. Esto se debe a que ciertas enfermedades y el uso de determinadas sustancias pueden causar sntomas similares a la depresin (depresin secundaria). Su mdico tambin podra derivarlo a Proofreader salud mental para Neomia Dear evaluacin y [place].  TRATAMIENTO  Es Public librarian los sntomas y Forensic scientist.  Los siguientes tratamientos pueden indicarse:     Medicamentos - Generalmente se recetan antidepresivos. Los antidepresivos se piensa que corrigen los desequilibrios qumicos en el cerebro que se asocian comnmente a la depresin Forensic scientistmayor. Se pueden agregar otros tipos de medicamentos si los sntomas no responden a los antidepresivos solos o si hay ideas delirantes o alucinaciones psicticas.  Psicoterapia - ciertos tipos de psicoterapia pueden ser tiles en el tratamiento del trastorno de la depresin mayor, proporcionando apoyo, educacin y Optometristorientacin. Ciertos tipos de psicoterapia tambin pueden ayudar a superar los pensamientos negativos (terapia cognitivo conductual) y a problemas de relacin que desencadena la depresin mayor (terapia interpersonal). Un especialista en salud mental puede ayudarlo a determinar qu tratamiento es el mejor para usted. La Harley-Davidsonmayora de los pacientes mejoran con una combinacin de medicacin y psicoterapia. Los tratamientos que implican la estimulacin elctrica del cerebro pueden ser utilizados en situaciones con sntomas muy graves o cuando los medicamentos y la psicoterapia no funcionan despus de un Jewelltiempo. Estos tratamientos incluyen terapia electroconvulsiva, estimulacin magntica transcraneal y estimulacin del nervio vago.    Esta informacin no tiene Theme park managercomo fin reemplazar el consejo del mdico. Asegrese de hacerle al mdico cualquier pregunta que tenga.   Document Released: 07/08/2012 Document Revised: 04/03/2014 Elsevier Interactive Patient Education 2016 Elsevier Inc.  Enfermedad por reflujo gastroesofgico en los adultos (Gastroesophageal Reflux Disease, Adult) Normalmente, los alimentos descienden por el esfago y se depositan en el estmago para su digestin. Sin embargo, cuando una persona tiene enfermedad por reflujo gastroesofgico (ERGE), los alimentos y el cido estomacal regresan al esfago. Cuando esto ocurre, el esfago se irrita y se inflama. Con el tiempo, la ERGE puede provocar la formacin de  pequeas perforaciones (lceras) en la mucosa del esfago.  CAUSAS Un problema del msculo que se encuentra entre el esfago y Investment banker, corporateel estmago (esfnter esofgico inferior o EEI) es la causa de esta enfermedad. Por lo general, el esfnter esofgico inferior se cierra despus de que los alimentos pasan a travs del esfago hacia el Farm Loopestmago. Cuando el EEI est debilitado o no es normal, no se cierra correctamente, lo que permite el paso retrgrado de los alimentos y el cido estomacal al esfago. Algunas sustancias de la dieta, algunos medicamentos y Materials engineerciertas enfermedades pueden debilitar este esfnter, entre ellos:  Consumo de tabaco.  Eagle BendEmbarazo.  Hernia de hiato.  Consumo excesivo de alcohol.  Algunos alimentos y [address]ciertas bebidas, como el caf, el chocolate, las cebollas y Interior and spatial designerla menta. FACTORES DE RIESGO Es ms probable que esta afeccin se manifieste en:  Los personas con sobrepeso.  Las personas con trastornos del tejido conjuntivo.  Las personas que toman antiinflamatorios no esteroides (AINE). SNTOMAS Los sntomas de esta afeccin incluyen lo siguiente:  Merchant navy officerAcidez estomacal.  Dificultad o dolor al tragar.  Sensacin de Warehouse managertener un bulto en la garganta.  Sabor amargo en la boca.  Mal aliento.  Gran cantidad de saliva.  Malestar estomacal o meteorismo.  Flatulencias.  Dolor en el pecho.  Falta de aire o sibilancias.  Tos permanente (crnica) o tos nocturna.  Desgaste el esmalte dental.  Prdida de peso. El dolor en el pecho puede deberse a muchas afecciones diferentes. Consulte al mdico si tiene Journalist, newspaperdolor en el pecho. DIAGNSTICO El mdico le har una historia clnica y un examen fsico. Para determinar si la ERGE es leve o grave, el mdico tambin puede controlar la respuesta al [place] Easttratamiento. Tambin pueden hacerle otros estudios, por ejemplo:  Una endoscopia para examinar el estmago y el esfago con Neomia Dearuna pequea  cmara.  Un estudio que determina el nivel de acidez en el  esfago.  Un estudio que mide la presin que hay en el esfago.  Un estudio de deglucin de bario o un estudio modificado de deglucin de bario para mostrar la forma, el tamao y el funcionamiento del esfago. TRATAMIENTO El objetivo del tratamiento es aliviar los sntomas y Automotive engineer las complicaciones. El tratamiento de esta afeccin puede variar en funcin de la gravedad de los sntomas. El mdico podr indicar lo siguiente:  Cambios en la dieta.  Medicamentos.  [place]. INSTRUCCIONES PARA EL CUIDADO EN EL HOGAR Dieta  Siga la dieta que le haya recomendado el mdico, la cual puede incluir evitar alimentos y bebidas tales como:  Caf y t (con o sin cafena).  Bebidas que contengan alcohol.  Bebidas energizantes y deportivas.  Gaseosas o refrescos.  Chocolate y cacao.  Menta y esencias de [address]  Ajo y cebollas.  Rbano picante.  Alimentos muy condimentados y cidos, entre ellos, pimientos, [place] en polvo, curry en polvo, vinagre, salsas picantes y [address]  Frutas ctricas y sus jugos, como naranjas, limones y limas.  Alimentos a base de tomates, como salsa roja, [place], salsa y pizza con salsa roja.  Alimentos fritos y Lexicographer, como rosquillas, papas fritas y aderezos con alto contenido de Holiday representative.  Carnes con alto contenido de [place], como hot dogs y cortes grasos de carnes rojas y blancas, por ejemplo, filetes de entrecot, salchicha, jamn y tocino.  Productos lcteos con alto contenido de [place], como [place], [place] y queso crema.  Haga comidas pequeas y frecuentes Freight forwarder de comidas abundantes.  Evite beber mucho lquido con las comidas.  No coma durante las 2 o 3horas previas a la hora de [place].  No se acueste inmediatamente despus de comer.  No haga actividad fsica enseguida despus de comer. Instrucciones generales  Est atento a cualquier cambio en los sntomas.  Tome los medicamentos de venta libre y los  recetados solamente como se lo haya indicado el mdico. No tome aspirina, ibuprofeno ni otros antiinflamatorios no esteroides (AINE), a menos que se lo haya indicado el mdico.  No consuma ningn producto que contenga tabaco, lo que incluye cigarrillos, tabaco de Theatre manager y Administrator, Civil Service. Si necesita ayuda para dejar de fumar, consulte al mdico.  Use ropas sueltas. No use prendas ajustadas alrededor de la cintura que ejerzan presin en el abdomen.  Levante (eleve) 6pulgadas (15centmetros) la cabecera de la cama.  Trate de reducir [employer] de estrs con actividades como el yoga o la meditacin. Si necesita ayuda para reducir [employer] de estrs, consulte al mdico.  Si tiene sobrepeso, Media planner un peso saludable. Hable con el mdico acerca de su peso ideal y pdale asesoramiento en cuanto a la dieta que debe seguir para Aeronautical engineer.  Concurra a todas las visitas de control como se lo haya indicado el mdico. Esto es importante. SOLICITE ATENCIN MDICA SI:  Aparecen nuevos sntomas.  [place] de peso sin causa aparente.  Tiene dificultad para tragar o siente dolor al [employer].  Tiene sibilancias o tos persistente.  Los sntomas no mejoran con Scientist, research (medical).  Tiene la voz ronca. SOLICITE ATENCIN MDICA DE [employer] SI:  Tiene dolor en los brazos, el cuello, los [place], la dentadura o la espalda.  Berenice Primas, se marea o tiene sensacin de desvanecimiento.  Siente falta de aire o Journalist, newspaper.  Vomita y el vmito es parecido a Risk manager o  a los granos de caf.  Se desmaya.  Las heces son sanguinolentas o de color negro.  No puede tragar, beber o comer.   Esta informacin no tiene Theme park manager el consejo del mdico. Asegrese de hacerle al mdico cualquier pregunta que tenga.   Document Released: 12/21/2004 Document Revised: 12/02/2014 Elsevier Interactive Patient Education [employer]

## 2015-01-16 NOTE — Progress Notes (Addendum)
Subjective:  This chart was scribed for [name], MD by [name], Medical Scribe. This patient was seen in Room 12 and the patient's care was started at 2:30 PM.   Patient ID: Anthony Kirby, male    DOB: February 15, 1976, 39 y.o.   MRN: [id number]  Chief Complaint  Patient presents with  . Gastroesophageal Reflux     1 month, burning and nasuea  . Depression    see screening  . Flu Vaccine  . Headache    x 2 months    HPI HPI Comments: Anthony Kirby is a 39 y.o. male who presents to [facility] complaining of abdominal pain.  Pt was seen in here by me in 07/2014 fo , pt reports mild improvement in his heart burn symptoms with the omeprazole. Pt saw [name], PA-C in July when he was complaining of epigastric pain for about 3 months. Per her notes he took the omeprazole for about a month, and she recommended that he starts Zyntac and Nexium. He had a CMP with one elevated liver test, and borderline blood sugar at 104, but CBC was normal. He does not appear that he has an H pylori test. Today, pt reports that he has associated symptoms of nausea onset today. Pt is not currently taking any medications for his current heartburn symptoms, and reports that he only received mild relief with the previous two medications that [name] in July.   Depression: When I saw pt in May, pt has some trouble sleeping, and depression symptoms since his father has past 2 year prior. He has taking Zoloft in the past, so I restarted the Zoloft in May. He stopped taking it after 10 days due to having side effects of headaches. When pt saw [name], he was still having some depressed mood at the time, decreased energy, and trouble sleeping with worry. So she started him on Lexapro, and was given Trazodone for sleep. Pt only took the lexapro for about 3 weeks due to experiencing mild trouble breathing and shortness of breath. Pt reports that he did take the Trazodone. Pt denies any  suicidal or homicidal thoughts. He does not consume alcohol.  Pt said that he will agree to try a new antidepressant, and if that does not work he will be referred to a psychiatrist. He does not have a regular psychiatrist now, however he is willing to be referred to one.   Headache: Pt reports symptoms of a waxing and waning headaches, onset years ago. He reports taking Tylenol when symptoms are severe. Pt denies blurry vision, one-sided weakness, dizziness, or any stroke-like symptoms.   Cough:  Pt reports symptoms of cough onset last week. Pt indicates that he experienced tightness in his right lower side of his chest when taking deep breaths. He reports symptoms of chills, however he denies measured fever.   There are no active problems to display for this patient.  Past Medical History  Diagnosis Date  . Gastritis   . GERD (gastroesophageal reflux disease)    History reviewed. No pertinent past surgical history. Allergies  Allergen Reactions  . Penicillins Hives   Prior to Admission medications   Medication Sig Start Date End Date Taking? Authorizing Provider  doxycycline (VIBRAMYCIN) 100 MG capsule Take 1 capsule (100 mg total) by mouth 2 (two) times daily. Patient not taking: Reported on 10/25/2014 10/23/14   Tishira R Brewington, PA-C  escitalopram (LEXAPRO) 10 MG tablet Take 1 tablet (10  mg total) by mouth daily. Patient not taking: Reported on 10/23/2014 10/09/14   Morrell Riddle, PA-C  esomeprazole (NEXIUM) 40 MG capsule Take 1 capsule (40 mg total) by mouth daily. Patient not taking: Reported on 01/16/2015 10/09/14   Morrell Riddle, PA-C  ranitidine (ZANTAC) 150 MG tablet Take 1 tablet (150 mg total) by mouth 2 (two) times daily. Patient not taking: Reported on 01/16/2015 10/09/14   Morrell Riddle, PA-C  traZODone (DESYREL) 50 MG tablet Take 0.5-1 tablets (25-50 mg total) by mouth at bedtime as needed for sleep. Patient not taking: Reported on 10/23/2014 10/09/14   Morrell Riddle,  PA-C   Social History   Social History  . Marital Status: Single    Spouse Name: N/A  . Number of Children: N/A  . Years of Education: N/A   Occupational History  . Not on file.   Social History Main Topics  . Smoking status: Never Smoker   . Smokeless tobacco: Never Used  . Alcohol Use: No  . Drug Use: Not on file  . Sexual Activity: Not on file   Other Topics Concern  . Not on file   Social History Narrative    Review of Systems  Constitutional: Positive for chills. Negative for fever.  Respiratory: Positive for cough and shortness of breath.   Gastrointestinal: Positive for nausea and abdominal pain. Negative for vomiting.  Neurological: Positive for headaches. Negative for facial asymmetry, speech difficulty, weakness and numbness.  Psychiatric/Behavioral: Positive for dysphoric mood. Negative for suicidal ideas and self-injury.       Objective:   Physical Exam  Constitutional: He is oriented to person, place, and time. He appears well-developed and well-nourished. No distress.  HENT:  Head: Normocephalic and atraumatic.  Eyes: EOM are normal. Pupils are equal, round, and reactive to light.  Neck: Neck supple.  Cardiovascular: Normal rate, regular rhythm and normal heart sounds.  Exam reveals no gallop and no friction rub.   No murmur heard. Pulmonary/Chest: Effort normal and breath sounds normal. No respiratory distress. He has no wheezes. He has no rales.  Abdominal: He exhibits no distension. There is tenderness (minimal epigastric tenderness ). There is no rebound and no guarding.  Neurological: He is alert and oriented to person, place, and time. No cranial nerve deficit.  Skin: Skin is warm and dry.  Psychiatric: He has a normal mood and affect. His behavior is normal.  Nursing note and vitals reviewed.  Filed Vitals:   01/16/15 1334  BP: 106/70  Pulse: 75  Temp: 98.8 F (37.1 C)  TempSrc: Oral  Resp: 20  Height: 5\' 7"  (1.702 m)  Weight: 161 lb  9.6 oz (73.301 kg)  SpO2: 99%   Results for orders placed or performed in visit on 01/16/15  POCT CBC  Result Value Ref Range   WBC 6.3 4.6 - 10.2 K/uL   Lymph, poc 2.0 0.6 - 3.4   POC LYMPH PERCENT 31.1 10 - 50 %L   MID (cbc) 0.4 0 - 0.9   POC MID % 6.9 0 - 12 %M   POC Granulocyte 3.9 2 - 6.9   Granulocyte percent 62.0 37 - 80 %G   RBC 5.53 4.69 - 6.13 M/uL   Hemoglobin 17.1 14.1 - 18.1 g/dL   HCT, POC 16.1 09.6 - 53.7 %   MCV 88.5 80 - 97 fL   MCH, POC 31.0 27 - 31.2 pg   MCHC 35.0 31.8 - 35.4 g/dL   RDW, POC 04.5 %  Platelet Count, POC 229 142 - 424 K/uL   MPV 6.8 0 - 99.8 fL       Assessment & Plan:   Anthony BaldingMorris Alvi is a 39 y.o. male Gastroesophageal reflux disease with esophagitis - Plan: esomeprazole (NEXIUM) 40 MG capsule, Ambulatory referral to Gastroenterology, POCT CBC, H. pylori breath test, Lipase, Elevated LFTs - Plan: Comprehensive metabolic panel, LUQ abdominal pain - Plan: Lipase  -persistent epigastric pain, gerd symptoms. Reassuring CBC.   -Continue PPI QD, trigger avoidance.   -H pylori testing.   Depression - Plan: FLUoxetine (PROZAC) 20 MG tablet  -persistent, but has not maintained SSRi for sufficient time d/t side effects. Options discussed, but decided on trial of Prozac. If not tolerating this, then will refer to psychiatry.   -numbers for counseling provided.   Cough - Plan: POCT CBC  -URI/viral likely. Sx care and RTC precautions given.   Nonintractable headache, unspecified chronicity pattern, unspecified headache type  -tension/stress HA possible.  Consider neuro eval, but decided on trial of Prozac first as if tension/stress HA, may improve with use of Prozac. Recheck in 3-4 weeks - sooner if worse.     Meds ordered this encounter  Medications  . esomeprazole (NEXIUM) 40 MG capsule    Sig: Take 1 capsule (40 mg total) by mouth daily.    Dispense:  30 capsule    Refill:  1  . FLUoxetine (PROZAC) 20 MG tablet    Sig: Take 1 tablet (20 mg  total) by mouth daily.    Dispense:  30 tablet    Refill:  1   Patient Instructions  Por estomago/acido - Nexium cada dia, y voy a referir a specialista.   Prozac cada dia por depression.  A veces esta medicina ayuda dolor de [place] tambien.  Regrese en 3-4 semanas habalr mas de su medicina. Si dolor de cabeza no Building control surveyoresta mejor - voy a referir a Hydrographic surveyorspecialista de cabeza. regrese aqui o cuarto de emergencia si empeorse.   Counseling: Di Kindlena Maria Buzzi: [phone] Hilma FavorsMary Ann Garcia: [phone]   Trastorno depresivo mayor (Major Depressive Disorder) El trastorno depresivo mayor es una enfermedad mental. Tambin se llamado depresin clnica o depresin unipolar. Produce sentimientos de tristeza, desesperanza o desamparo. Algunas personas con trastorno depresivo mayor no se sienten particularmente tristes, pero pierden Press photographerel inters en hacer las cosas que solan disfrutar (anhedonia). Tambin puede causar sntomas fsicos. Interfiere en el trabajo, la escuela, las relaciones y otras actividades diarias normales. Puede variar en gravedad, pero es ms duradera y ms grave que la tristeza que todos sentimos de vez en cuando en nuestras vidas.  Muchas veces es desencadenada por sucesos estresantes o cambios importantes en la vida. Algunos ejemplos de estos factores desencadenantes son el divorcio, la prdida del trabajo o el hogar, [place] mud[place], y la muerte de un familiar o amigo cercano. A veces aparece sin ninguna razn evidente. Las personas que tienen familiares con depresin mayor o con trastorno bipolar tienen ms riesgo de desarrollar depresin mayor con o sin factores de Librarian, academicestrs. Puede ocurrir a Actuarycualquier edad. Puede ocurrir slo una vez en su vida(episodio nico de trastorno depresivo mayor). Puede ocurrir varias veces (trastorno depresivo mayor recurrente).  SNTOMAS  Las personas con trastorno depresivo mayor presentan anhedonia o estado de nimo deprimido casi CarMaxtodos los das durante al menos 2 semanas o ms. Los  sntomas son:   Sensacin de tristeza Designer, jewellery(melancola) o vaco.  Sentimientos de desesperanza o desamparo.  Lagrimeo o episodios de llanto ( es observado por  los dems).  Irritabilidad (en nios y adolescentes). Adems del estado de nimo deprimido o la anhedonia o ambos, estos enfermos tienen al menos cuatro de los siguientes sntomas :   Dificultad para dormir o Engineer, site.   Cambio significativo (aumento o disminucin) en el apetito o Education officer, community.   Falta de energa o motivacin.  Sentimientos de culpa o desvalorizacin.   Dificultad para concentrarse, recordar o tomar decisiones.  Movimientos inusualmente lentos (retardo psicomotor retardation) o inquietud (segn lo observado por los dems).   Deseos recurrentes de muerte, pensamientos recurrentes de autoagresin (suicidio) o intento de suicidio. Las personas con trastorno depresivo mayor suelen tener pensamientos persistentes negativos acerca de s mismos, de otras personas y del mundo. Las personas con trastorno depresivo mayor grave pueden experimentar creencias o percepciones distorsionadas sobre el mundo (delirios psicticos). Tambin pueden ver u or cosas que no son reales(alucinaciones psicticas).  DIAGNSTICO  El diagnstico se realiza mediante una evaluacin hecha por el mdico. El mdico le preguntar acerca de los aspectos de su vida cotidiana, como el Harmony de nimo, el sueo y el apetito, para ver si usted tiene los sntomas de Animator. Le har preguntas sobre su historial mdico y el consumo de alcohol o drogas, incluyendo medicamentos recetados. [employer] un examen fsico y Education administrator anlisis de [place]. Esto se debe a que ciertas enfermedades y el uso de determinadas sustancias pueden causar sntomas similares a la depresin (depresin secundaria). Su mdico tambin podra derivarlo a Proofreader salud mental para Neomia Dear evaluacin y [place].  TRATAMIENTO  Es Public librarian los  sntomas y Forensic scientist. Los siguientes tratamientos pueden indicarse:    Medicamentos - Generalmente se recetan antidepresivos. Los antidepresivos se piensa que corrigen los desequilibrios qumicos en el cerebro que se asocian comnmente a la depresin Forensic scientist. Se pueden agregar otros tipos de medicamentos si los sntomas no responden a los antidepresivos solos o si hay ideas delirantes o alucinaciones psicticas.  Psicoterapia - ciertos tipos de psicoterapia pueden ser tiles en el tratamiento del trastorno de la depresin mayor, proporcionando apoyo, educacin y Optometrist. Ciertos tipos de psicoterapia tambin pueden ayudar a superar los pensamientos negativos (terapia cognitivo conductual) y a problemas de relacin que desencadena la depresin mayor (terapia interpersonal). Un especialista en salud mental puede ayudarlo a determinar qu tratamiento es el mejor para usted. La [employer] de los pacientes mejoran con una combinacin de medicacin y psicoterapia. Los tratamientos que implican la estimulacin elctrica del cerebro pueden ser utilizados en situaciones con sntomas muy graves o cuando los medicamentos y la psicoterapia no funcionan despus de un [place]. Estos tratamientos incluyen terapia electroconvulsiva, estimulacin magntica transcraneal y estimulacin del nervio vago.    Esta informacin no tiene Theme park manager el consejo del mdico. Asegrese de hacerle al mdico cualquier pregunta que tenga.   Document Released: 07/08/2012 Document Revised: 04/03/2014 Elsevier Interactive Patient Education 2016 Elsevier Inc.  Enfermedad por reflujo gastroesofgico en los adultos (Gastroesophageal Reflux Disease, Adult) Normalmente, los alimentos descienden por el esfago y se depositan en el estmago para su digestin. Sin embargo, cuando una persona tiene enfermedad por reflujo gastroesofgico (ERGE), los alimentos y el cido estomacal regresan al esfago. Cuando esto ocurre, el esfago  se irrita y se inflama. Con el tiempo, la ERGE puede provocar la formacin de pequeas perforaciones (lceras) en la mucosa del esfago.  CAUSAS Un problema del msculo que se encuentra entre el esfago y Investment banker, corporate (esfnter esofgico inferior o EEI) es la causa de [place]  enfermedad. Por lo general, el esfnter esofgico inferior se cierra despus de que los alimentos pasan a travs del esfago hacia el [place]. Cuando el EEI est debilitado o no es normal, no se cierra correctamente, lo que permite el paso retrgrado de los alimentos y el cido estomacal al esfago. Algunas sustancias de la dieta, algunos medicamentos y Materials engineer enfermedades pueden debilitar este esfnter, entre ellos:  Consumo de tabaco.  Jessie.  Hernia de hiato.  Consumo excesivo de alcohol.  Algunos alimentos y [address], como el caf, el chocolate, las cebollas y Interior and spatial designer. FACTORES DE RIESGO Es ms probable que esta afeccin se manifieste en:  Los personas con sobrepeso.  Las personas con trastornos del tejido conjuntivo.  Las personas que toman antiinflamatorios no esteroides (AINE). SNTOMAS Los sntomas de esta afeccin incluyen lo siguiente:  Merchant navy officer.  Dificultad o dolor al tragar.  Sensacin de Warehouse manager un bulto en la garganta.  Sabor amargo en la boca.  Mal aliento.  Gran cantidad de saliva.  Malestar estomacal o meteorismo.  Flatulencias.  Dolor en el pecho.  Falta de aire o sibilancias.  Tos permanente (crnica) o tos nocturna.  Desgaste el esmalte dental.  Prdida de peso. El dolor en el pecho puede deberse a muchas afecciones diferentes. Consulte al mdico si tiene Journalist, newspaper. DIAGNSTICO El mdico le har una historia clnica y un examen fsico. Para determinar si la ERGE es leve o grave, el mdico tambin puede controlar la respuesta al [place]. Tambin pueden hacerle otros estudios, por ejemplo:  Una endoscopia para examinar el estmago y el esfago con  Neomia Dear pequea cmara.  Un estudio que determina el nivel de acidez en el esfago.  Un estudio que mide la presin que hay en el esfago.  Un estudio de deglucin de bario o un estudio modificado de deglucin de bario para mostrar la forma, el tamao y el funcionamiento del esfago. TRATAMIENTO El objetivo del tratamiento es aliviar los sntomas y Automotive engineer las complicaciones. El tratamiento de esta afeccin puede variar en funcin de la gravedad de los sntomas. El mdico podr indicar lo siguiente:  Cambios en la dieta.  Medicamentos.  [place]. INSTRUCCIONES PARA EL CUIDADO EN EL HOGAR Dieta  Siga la dieta que le haya recomendado el mdico, la cual puede incluir evitar alimentos y bebidas tales como:  Caf y t (con o sin cafena).  Bebidas que contengan alcohol.  Bebidas energizantes y deportivas.  Gaseosas o refrescos.  Chocolate y cacao.  Menta y esencias de [address]  Ajo y cebollas.  Rbano picante.  Alimentos muy condimentados y cidos, entre ellos, pimientos, [place] en polvo, curry en polvo, vinagre, salsas picantes y [address]  Frutas ctricas y sus jugos, como naranjas, limones y limas.  Alimentos a base de tomates, como salsa roja, [place], salsa y pizza con salsa roja.  Alimentos fritos y Lexicographer, como rosquillas, papas fritas y aderezos con alto contenido de Holiday representative.  Carnes con alto contenido de [place], como hot dogs y cortes grasos de carnes rojas y blancas, por ejemplo, filetes de entrecot, salchicha, jamn y tocino.  Productos lcteos con alto contenido de [place], como [place], [place] y queso crema.  Haga comidas pequeas y frecuentes Freight forwarder de comidas abundantes.  Evite beber mucho lquido con las comidas.  No coma durante las 2 o 3horas previas a la hora de [place].  No se acueste inmediatamente despus de comer.  No haga actividad fsica enseguida despus de comer. Instrucciones generales  Est atento a cualquier  cambio en los sntomas.  Tome los medicamentos de venta libre y los recetados solamente como se lo haya indicado el mdico. No tome aspirina, ibuprofeno ni otros antiinflamatorios no esteroides (AINE), a menos que se lo haya indicado el mdico.  No consuma ningn producto que contenga tabaco, lo que incluye cigarrillos, tabaco de Theatre manager y Administrator, Civil Service. Si necesita ayuda para dejar de fumar, consulte al mdico.  Use ropas sueltas. No use prendas ajustadas alrededor de la cintura que ejerzan presin en el abdomen.  Levante (eleve) 6pulgadas (15centmetros) la cabecera de la cama.  Trate de reducir [employer] de estrs con actividades como el yoga o la meditacin. Si necesita ayuda para reducir [employer] de estrs, consulte al mdico.  Si tiene sobrepeso, Media planner un peso saludable. Hable con el mdico acerca de su peso ideal y pdale asesoramiento en cuanto a la dieta que debe seguir para Aeronautical engineer.  Concurra a todas las visitas de control como se lo haya indicado el mdico. Esto es importante. SOLICITE ATENCIN MDICA SI:  Aparecen nuevos sntomas.  Baja de peso sin causa aparente.  Tiene dificultad para tragar o siente dolor al [employer].  Tiene sibilancias o tos persistente.  Los sntomas no mejoran con Scientist, research (medical).  Tiene la voz ronca. SOLICITE ATENCIN MDICA DE [employer] SI:  Tiene dolor en los brazos, el cuello, los [place], la dentadura o la espalda.  Berenice Primas, se marea o tiene sensacin de desvanecimiento.  Siente falta de aire o Journalist, newspaper.  Vomita y el vmito es parecido a la sangre o a los granos de caf.  Se desmaya.  Las heces son sanguinolentas o de color negro.  No puede tragar, beber o comer.   Esta informacin no tiene Theme park manager el consejo del mdico. Asegrese de hacerle al mdico cualquier pregunta que tenga.   Document Released: 12/21/2004 Document Revised: 12/02/2014 Elsevier Interactive Patient  Education [employer]        By signing my name below, I, Rawaa Al Rifaie, attest that this documentation has been prepared under the direction and in the presence of [name], MD.  [name], Medical Scribe. 01/16/2015.  3:00 PM.  I personally performed the services described in this documentation, which was scribed in my presence. The recorded information has been reviewed and considered, and addended by me as needed.

## 2015-01-17 DIAGNOSIS — F329 Major depressive disorder, single episode, unspecified: Secondary | ICD-10-CM | POA: Insufficient documentation

## 2015-01-17 DIAGNOSIS — F32A Depression, unspecified: Secondary | ICD-10-CM | POA: Insufficient documentation

## 2015-01-17 DIAGNOSIS — K219 Gastro-esophageal reflux disease without esophagitis: Secondary | ICD-10-CM | POA: Insufficient documentation

## 2015-01-18 LAB — H. PYLORI BREATH TEST: H. pylori Breath Test: NOT DETECTED

## 2015-10-23 ENCOUNTER — Ambulatory Visit (INDEPENDENT_AMBULATORY_CARE_PROVIDER_SITE_OTHER): Payer: Commercial Managed Care - HMO | Admitting: Family Medicine

## 2015-10-23 VITALS — BP 108/72 | HR 76 | Temp 98.6°F | Resp 18 | Ht 67.0 in | Wt 164.0 lb

## 2015-10-23 DIAGNOSIS — R7989 Other specified abnormal findings of blood chemistry: Secondary | ICD-10-CM

## 2015-10-23 DIAGNOSIS — R739 Hyperglycemia, unspecified: Secondary | ICD-10-CM

## 2015-10-23 DIAGNOSIS — R945 Abnormal results of liver function studies: Principal | ICD-10-CM

## 2015-10-23 DIAGNOSIS — K2100 Gastro-esophageal reflux disease with esophagitis, without bleeding: Secondary | ICD-10-CM

## 2015-10-23 LAB — HEPATIC FUNCTION PANEL
ALT: 59 U/L — ABNORMAL HIGH (ref 9–46)
AST: 29 U/L (ref 10–40)
Albumin: 4.9 g/dL (ref 3.6–5.1)
Alkaline Phosphatase: 64 U/L (ref 40–115)
Bilirubin, Direct: 0.1 mg/dL (ref <=0.2)
Indirect Bilirubin: 0.5 mg/dL (ref 0.2–1.2)
Total Bilirubin: 0.6 mg/dL (ref 0.2–1.2)
Total Protein: 7.4 g/dL (ref 6.1–8.1)

## 2015-10-23 MED ORDER — ESOMEPRAZOLE MAGNESIUM 40 MG PO CPDR: 40.0000 mg | DELAYED_RELEASE_CAPSULE | Freq: Every day | ORAL | 1 refills | Status: DC

## 2015-10-23 NOTE — Progress Notes (Signed)
Subjective:  By signing my name below, I, Stann Ore, attest that this documentation has been prepared under the direction and in the presence of [name], MD. Electronically Signed: Stann Ore, Scribe. 10/23/2015 , 3:45 PM .  Patient was seen in Room 5 .   Patient ID: Anthony Kirby, male    DOB: 1975/08/24, 40 y.o.   MRN: [id number] Chief Complaint  Patient presents with   Heartburn    several years, especially after eating spicy foods   HPI Anthony Kirby is a 40 y.o. male Here for heartburn, most notably after eating spicy foods, ongoing for several years. H/o GERD. He was most recently in October 2016 for similar symptoms. He had epigastric pain previously; I prescribed nexium 40mg  qd and referred to GI. He had a negative H. pylori breath test. He also had a normal lipase, overall normal CMP when seen last year with borderline elevated ALT. Based on notes, he's never scheduled GI appointment.   Heartburn Patient reports he stopped taking nexium about 4 weeks ago. He had some improvement with the nexium. He denies following up with GI because he had an endoscopy 3 years ago which was normal. He's noticed heartburn worsening when he eats spicy foods. He's lost about 5 lbs with working out. He denies vomiting, diarrhea or nausea. He denies alcohol or smoking. His last meal was at 12:30PM.   Depression He also has h/o depression, taken prozac in the past. He's currently taking zoloft prescribed from [place] and still has some tablets available.   Hyperglycemia He had a glucose of 104 but not sure if he was fasting at last visit.   Patient Active Problem List   Diagnosis Date Noted   GERD (gastroesophageal reflux disease) 01/17/2015   Depression 01/17/2015   Past Medical History:  Diagnosis Date   Gastritis    GERD (gastroesophageal reflux disease)    History reviewed. No pertinent surgical history. Allergies  Allergen Reactions   Penicillins Hives   Prior  to Admission medications   Medication Sig Start Date End Date Taking? Authorizing Provider  esomeprazole (NEXIUM) 40 MG capsule Take 1 capsule (40 mg total) by mouth daily. Patient not taking: Reported on 10/23/2015 01/16/15   Shade Flood, MD  FLUoxetine (PROZAC) 20 MG tablet Take 1 tablet (20 mg total) by mouth daily. Patient not taking: Reported on 10/23/2015 01/16/15   Shade Flood, MD   Social History   Social History   Marital status: Single    Spouse name: N/A   Number of children: N/A   Years of education: N/A   Occupational History   Not on file.   Social History Main Topics   Smoking status: Never Smoker   Smokeless tobacco: Never Used   Alcohol use No   Drug use: Unknown   Sexual activity: Not on file   Other Topics Concern   Not on file   Social History Narrative   No narrative on file   Review of Systems  Constitutional: Negative for chills, fatigue, fever and unexpected weight change.  Respiratory: Negative for cough, shortness of breath and wheezing.   Cardiovascular: Negative for chest pain.  Gastrointestinal: Positive for abdominal pain. Negative for constipation, diarrhea, nausea and vomiting.       Objective:   Physical Exam  Constitutional: He is oriented to person, place, and time. He appears well-developed and well-nourished. No distress.  HENT:  Head: Normocephalic and atraumatic.  Eyes: EOM are normal. Pupils are equal, round,  and reactive to light.  Neck: Neck supple.  Cardiovascular: Normal rate.   Pulmonary/Chest: Effort normal. No respiratory distress.  Abdominal: Soft. Bowel sounds are normal. He exhibits no distension. There is no hepatosplenomegaly. There is no tenderness. There is no CVA tenderness.  Musculoskeletal: Normal range of motion.  Neurological: He is alert and oriented to person, place, and time.  Skin: Skin is warm and dry.  Psychiatric: He has a normal mood and affect. His behavior is normal.  Nursing  note and vitals reviewed.   Vitals:   10/23/15 1520  BP: 108/72  Pulse: 76  Resp: 18  Temp: 98.6 F (37 C)  TempSrc: Oral  SpO2: 99%  Weight: 164 lb (74.4 kg)  Height: 5\' 7"  (1.702 m)      Assessment & Plan:     Anthony Kirby is a 40 y.o. male Elevated LFTs - Plan: Hepatic Function Panel  - 1 borderline LFT when checked in October. Repeat hepatic function panel.  Gastroesophageal reflux disease with esophagitis - Plan: esomeprazole (NEXIUM) 40 MG capsule, Ambulatory referral to Gastroenterology  - Persistent, and recurs off of Nexium. Again discussed trigger avoidance, handout given, restart Nexium 40 mg daily, and discussed need to follow-up with gastroenterology due to persistent symptoms. Repeat referral placed.   Hyperglycemia - Plan: Hemoglobin A1C  And to follow-up in the next 4-6 weeks to discuss medicine for depression and his inhaler he is using for possible asthma.  rtc precautions if worse.  Meds ordered this encounter  Medications   esomeprazole (NEXIUM) 40 MG capsule    Sig: Take 1 capsule (40 mg total) by mouth daily.    Dispense:  30 capsule    Refill:  1   Patient Instructions   Restart Nexium once per day. Avoid foods below that cause more heartburn. Follow-up with gastroenterology as planned. I will refer you again. I am also checking your liver test again as one was barely elevated last visit.   I am checking your blood sugar average over the past 3 months. If this is elevated, we can discuss further at next visit.  Return within the next 4-6 weeks to talk about the inhaler you're using in the medication for depression.  Return to the clinic or go to the nearest emergency room if any of your symptoms worsen or new symptoms occur.   Opciones de alimentos para pacientes con reflujo gastroesofgico - Adultos (Food Choices for Gastroesophageal Reflux Disease, Adult) Cuando se tiene reflujo gastroesofgico (ERGE), los alimentos que se ingieren y los  hbitos de alimentacin son muy importantes. Elegir los alimentos adecuados puede ayudar a Paramedic las molestias ocasionadas por el [place]. QU PAUTAS GENERALES DEBO SEGUIR?  Elija las frutas, los vegetales, los cereales integrales, los productos lcteos, la carne de [place], de pescado y de ave con bajo contenido de grasas.  Limite las grasas, [address] [place], los aderezos para [place], la [place], los frutos secos y Programme researcher, broadcasting/film/video.  Lleve un registro de las comidas para identificar los alimentos que ocasionan sntomas.  Evite los alimentos que le ocasionen reflujo. Pueden ser distintos para cada persona.  Haga comidas pequeas con frecuencia en lugar de tres comidas [employer].  Coma lentamente, en un clima distendido.  Limite el consumo de alimentos fritos.  Cocine los alimentos utilizando mtodos que no sean la fritura.  Evite el consumo alcohol.  Evite beber grandes cantidades de lquidos con las comidas.  Evite agacharse o recostarse hasta despus de 2 o 3horas de  haber comido. QU ALIMENTOS NO SE RECOMIENDAN? Los siguientes son algunos alimentos y bebidas que pueden empeorar los sntomas: Veterinary surgeon. Jugo de tomate. Salsa de tomate y espagueti. Ajes. [place] y [place]. Rbano picante. Frutas Naranjas, pomelos y limn (fruta y [place]). Carnes Carnes de [place], de pescado y de ave con gran contenido de grasas. Esto incluye los perros calientes, las [place], el [place], la salchicha, el salame y el tocino. Lcteos Leche entera y [place]. [employer]PG [employer]. Crema. [place]. Helados. Queso crema.  Bebidas Caf y t negro, con o sin cafena Bebidas gaseosas o energizantes. Condimentos Salsa picante. Salsa barbacoa.  Dulces/postres Chocolate y cacao. Rosquillas. Menta y mentol. Grasas y [employer] con alto contenido de grasas, incluidas las papas fritas. Otros Vinagre. Especias picantes, como la [employer], la pimienta blanca, la pimienta roja,  la pimienta de cayena, el curry en [place], los clavos de [place], el jengibre y el [place] en polvo. Los artculos mencionados arriba pueden no ser [employer]on de las bebidas y los alimentos que se Theatre stage manager. Comunquese con el nutricionista para recibir ms informacin.   Esta informacin no tiene Theme park manager el consejo del mdico. Asegrese de hacerle al mdico cualquier pregunta que tenga.   Document Released: 12/21/2004 Document Revised: 04/03/2014 Elsevier Interactive Patient Education [employer]     IF you received an x-ray today, you will receive an invoice from [facility]. Please contact [facility] at [phone] with questions or concerns regarding your invoice.   IF you received labwork today, you will receive an invoice from [employer] [facility]. Please contact [facility] at [phone] with questions or concerns regarding your invoice.   Our billing staff will not be able to assist you with questions regarding bills from these companies.  You will be contacted with the lab results as soon as they are available. The fastest way to get your results is to activate your My Chart account. Instructions are located on the last page of this paperwork. If you have not heard from [place] regarding the results in 2 weeks, please contact this office.        I personally performed the services described in this documentation, which was scribed in my presence. The recorded information has been reviewed and considered, and addended by me as needed.   Signed,   [name], MD [facility].  10/23/15 3:45 PM

## 2015-10-23 NOTE — Patient Instructions (Addendum)
Restart Nexium once per day. Avoid foods below that cause more heartburn. Follow-up with gastroenterology as planned. I will refer you again. I am also checking your liver test again as one was barely elevated last visit.   I am checking your blood sugar average over the past 3 months. If this is elevated, we can discuss further at next visit.  Return within the next 4-6 weeks to talk about the inhaler you're using in the medication for depression.  Return to the clinic or go to the nearest emergency room if any of your symptoms worsen or new symptoms occur.   Opciones de alimentos para pacientes con reflujo gastroesofgico - Adultos (Food Choices for Gastroesophageal Reflux Disease, Adult) Cuando se tiene reflujo gastroesofgico (ERGE), los alimentos que se ingieren y los hbitos de alimentacin son muy importantes. Elegir los alimentos adecuados puede ayudar a Paramedic las molestias ocasionadas por el [place]. QU PAUTAS GENERALES DEBO SEGUIR?  Elija las frutas, los vegetales, los cereales integrales, los productos lcteos, la carne de [place], de pescado y de ave con bajo contenido de grasas.  Limite las grasas, [address] [place], los aderezos para [place], la [place], los frutos secos y Programme researcher, broadcasting/film/video.  Lleve un registro de las comidas para identificar los alimentos que ocasionan sntomas.  Evite los alimentos que le ocasionen reflujo. Pueden ser distintos para cada persona.  Haga comidas pequeas con frecuencia en lugar de tres comidas [employer].  Coma lentamente, en un clima distendido.  Limite el consumo de alimentos fritos.  Cocine los alimentos utilizando mtodos que no sean la fritura.  Evite el consumo alcohol.  Evite beber grandes cantidades de lquidos con las comidas.  Evite agacharse o recostarse hasta despus de 2 o 3horas de haber comido. QU ALIMENTOS NO SE RECOMIENDAN? Los siguientes son algunos alimentos y bebidas que pueden empeorar los  sntomas: Veterinary surgeon. Jugo de tomate. Salsa de tomate y espagueti. Ajes. [place] y [place]. Rbano picante. Frutas Naranjas, pomelos y limn (fruta y [place]). Carnes Carnes de [place], de pescado y de ave con gran contenido de grasas. Esto incluye los perros calientes, las [place], el [place], la salchicha, el salame y el tocino. Lcteos Leche entera y [place]. [employer]PG [employer]. Crema. [place]. Helados. Queso crema.  Bebidas Caf y t negro, con o sin cafena Bebidas gaseosas o energizantes. Condimentos Salsa picante. Salsa barbacoa.  Dulces/postres Chocolate y cacao. Rosquillas. Menta y mentol. Grasas y [employer] con alto contenido de grasas, incluidas las papas fritas. Otros Vinagre. Especias picantes, como la [employer], la pimienta blanca, la pimienta roja, la pimienta de cayena, el curry en [place], los clavos de [place], el jengibre y el [place] en polvo. Los artculos mencionados arriba pueden no ser Raytheon de las bebidas y los alimentos que se Theatre stage manager. Comunquese con el nutricionista para recibir ms informacin.   Esta informacin no tiene Theme park manager el consejo del mdico. Asegrese de hacerle al mdico cualquier pregunta que tenga.   Document Released: 12/21/2004 Document Revised: 04/03/2014 Elsevier Interactive Patient Education Yahoo[employer] Inc.     IF you received an x-ray today, you will receive an invoice from [facility]. Please contact [facility] at [phone] with questions or concerns regarding your invoice.   IF you received labwork today, you will receive an invoice from [facility]. Please contact [facility] at [phone] with questions or concerns regarding your invoice.   Our billing staff will not be able to assist you with questions  regarding bills from these companies.  You will be contacted with the lab results as soon as they are available. The fastest way to get  your results is to activate your My Chart account. Instructions are located on the last page of this paperwork. If you have not heard from [place] regarding the results in 2 weeks, please contact this office.

## 2015-10-24 LAB — HEMOGLOBIN A1C
Hgb A1c MFr Bld: 5.4 % (ref <5.7)
Mean Plasma Glucose: 108 mg/dL

## 2015-10-26 ENCOUNTER — Telehealth: Payer: Self-pay

## 2015-10-26 MED ORDER — PANTOPRAZOLE SODIUM 40 MG PO TBEC: 40.0000 mg | DELAYED_RELEASE_TABLET | Freq: Every day | ORAL | 1 refills | Status: DC

## 2015-10-26 NOTE — Telephone Encounter (Signed)
Called insurance because esomeprazole was not going through at [employer]. Ins advised that esomeprazole and brand Nexium are plan exclusions. The only formulary alternatives are pantoprazole, both 40 mg and 20 mg caps, and the packets of omeprazole. Since pt has failed omeprazole in the past do you want to try pantoprazole? I will pend for the 40 mg caps.

## 2015-10-26 NOTE — Telephone Encounter (Signed)
Okay to fill pantoprazole. Medication sent.

## 2015-10-28 ENCOUNTER — Encounter: Payer: Self-pay | Admitting: Internal Medicine

## 2016-01-03 ENCOUNTER — Ambulatory Visit: Payer: Self-pay | Admitting: Internal Medicine

## 2016-01-17 ENCOUNTER — Ambulatory Visit (INDEPENDENT_AMBULATORY_CARE_PROVIDER_SITE_OTHER): Payer: Commercial Managed Care - HMO | Admitting: Family Medicine

## 2016-01-17 VITALS — BP 100/68 | HR 72 | Temp 98.5°F | Resp 18 | Ht 67.0 in | Wt 159.0 lb

## 2016-01-17 DIAGNOSIS — R109 Unspecified abdominal pain: Secondary | ICD-10-CM

## 2016-01-17 DIAGNOSIS — R197 Diarrhea, unspecified: Secondary | ICD-10-CM

## 2016-01-17 DIAGNOSIS — Z23 Encounter for immunization: Secondary | ICD-10-CM

## 2016-01-17 MED ORDER — ONDANSETRON 8 MG PO TBDP: 8.0000 mg | ORAL_TABLET | Freq: Three times a day (TID) | ORAL | 0 refills | Status: DC | PRN

## 2016-01-17 MED ORDER — RANITIDINE HCL 150 MG PO TABS: 150.0000 mg | ORAL_TABLET | Freq: Two times a day (BID) | ORAL | 0 refills | Status: DC

## 2016-01-17 NOTE — Progress Notes (Signed)
Patient ID: Anthony BaldingMorris Kirby, male    DOB: [birth year], 40 y.o.   MRN: [id number]  PCP: No PCP Per Patient  Chief Complaint  Patient presents with  . Emesis  . Diarrhea    Subjective:   HPI 40 year old male, present for evaluation of abdominal pain, nausea w/vomting, and diarrhea. He reports 4 days ago he developed an upset stomach after eating a fish dish [place] a [place] [employer] [place]. He ate alone so unsure if anyone else at the location became ill. He reports diarrhea, nausea, and severe stomach cramping started 4 days ago. By day 3, he was vomiting, diarrhea and abdominal persisted. He presents today and reports only a small amount of diarrhea but continues abdominal pain and nausea. Reports his stomach occasionally makes a loud growling sound. He chronically takes Nexium but hasn't taken any since last week prior to onset of illness. He took Pepto bismuth without relief of symptoms.  Reports subject fever and cold chills Saturday that has since resolved. He is able to tolerate soup and fluids although appetite is significantly diminished.  Social History   Social History  . Marital status: Single    Spouse name: N/A  . Number of children: N/A  . Years of education: N/A   Occupational History  . Not on file.   Social History Main Topics  . Smoking status: Never Smoker  . Smokeless tobacco: Never Used  . Alcohol use No  . Drug use: No  . Sexual activity: Not on file   Other Topics Concern  . Not on file   Social History Narrative  . No narrative on file   Family History  Problem Relation Age of Onset  . Hypertension Mother    Review of Systems See HPI  Patient Active Problem List   Diagnosis Date Noted  . GERD (gastroesophageal reflux disease) 01/17/2015  . Depression 01/17/2015     Prior to Admission medications   Medication Sig Start Date End Date Taking? Authorizing Provider  esomeprazole (NEXIUM) 40 MG capsule Take 1 capsule (40 mg total) by mouth daily.  10/23/15  Yes Shade FloodJeffrey R Greene, MD  budesonide-formoterol [facility](SYMBICORT) 160-4.5 MCG/ACT inhaler Inhale 2 puffs into the lungs 2 (two) times daily.    Historical Provider, MD  sertraline (ZOLOFT) 50 MG tablet Take 50 mg by mouth daily.    Historical Provider, MD     Allergies  Allergen Reactions  . Penicillins Hives       Objective:  Physical Exam  Constitutional: He is oriented to person, place, and time. He appears well-developed.  HENT:  Head: Normocephalic and atraumatic.  Right Ear: External ear normal.  Left Ear: External ear normal.  Nose: Nose normal.  Mouth/Throat: Oropharynx is clear and moist.  Eyes: Conjunctivae and EOM are normal. Pupils are equal, round, and reactive to light.  Neck: Normal range of motion. Neck supple.  Cardiovascular: Normal rate, regular rhythm, normal heart sounds and intact distal pulses.   Pulmonary/Chest: Effort normal and breath sounds normal.  Abdominal: Soft. Bowel sounds are increased. There is no splenomegaly or hepatomegaly. There is tenderness in the epigastric area. There is no rigidity, no rebound, no guarding, no CVA tenderness, no tenderness at McBurney's point and negative Murphy's sign.    Musculoskeletal: Normal range of motion.  Neurological: He is alert and oriented to person, place, and time.  Skin: Skin is warm and dry.  Psychiatric: He has a normal mood and affect. His behavior is normal. Judgment and thought  content normal.   Vitals:   01/17/16 1209  BP: 100/68  Pulse: 72  Resp: 18  Temp: 98.5 F (36.9 C)     Assessment & Plan:  1. Abdominal pain, unspecified abdominal location - H. pylori breath test - Gastrointestinal Pathogen Panel PCR  2. Diarrhea, unspecified type - Gastrointestinal Pathogen Panel PCR  3. Need for prophylactic vaccination and inoculation against influenza - Flu Vaccine QUAD 36+ mos IM   40 year old male presents with a 4 day acute onset of abdominal pain with nausea, vomiting, and diarrhea  which began after eating a seafood dish at [employer]. Screening for H. Pylori and GI pathogen panel. Will treat symptomatically for now.  Plan:  Take Ranitidine 150 mg twice daily until abdominal discomfort resolves.  Take Zofran 8 mg every 8 hours as needed for nausea.  Continue bland diet until symptoms resolve.  Follow-up as needed.  We will contact you with your lab results.    Godfrey Pick. Tiburcio Pea, MSN, FNP-C [facility]

## 2016-01-17 NOTE — Patient Instructions (Addendum)
Take Ranitidine 150 mg twice daily until abdominal discomfort resolves.  Take Zofran 8 mg every 8 hours as needed for nausea.  Continue bland diet. Will follow-up with lab results.  Godfrey PickKimberly S. Tiburcio PeaHarris, MSN, FNP-C [facility]  IF you received an x-ray today, you will receive an invoice from [facility]. Please contact [facility] at [phone] with questions or concerns regarding your invoice.   IF you received labwork today, you will receive an invoice from [facility]. Please contact [facility] at [phone] with questions or concerns regarding your invoice.   Our billing staff will not be able to assist you with questions regarding bills from these companies.  You will be contacted with the lab results as soon as they are available. The fastest way to get your results is to activate your My Chart account. Instructions are located on the last page of this paperwork. If you have not heard from us regarding the results in 2 weeks, please contact this office.    Food Choices for Gastroesophageal Reflux Disease, Adult When you have gastroesophageal reflux disease (GERD), the foods you eat and your eating habits are very important. Choosing the right foods can help ease the discomfort of GERD. WHAT GENERAL GUIDELINES DO I NEED TO FOLLOW?  Choose fruits, vegetables, whole grains, low-fat dairy products, and low-fat meat, fish, and poultry.  Limit fats such as oils, salad dressings, butter, nuts, and avocado.  Keep a food diary to identify foods that cause symptoms.  Avoid foods that cause reflux. These may be different for different people.  Eat frequent small meals instead of three large meals each day.  Eat your meals slowly, in a relaxed setting.  Limit fried foods.  Cook foods using methods other than frying.  Avoid drinking alcohol.  Avoid drinking large amounts of liquids with your  meals.  Avoid bending over or lying down until 2-3 hours after eating. WHAT FOODS ARE NOT RECOMMENDED? The following are some foods and drinks that may worsen your symptoms: Vegetables Tomatoes. Tomato juice. Tomato and spaghetti sauce. Chili peppers. Onion and garlic. Horseradish. Fruits Oranges, grapefruit, and lemon (fruit and juice). Meats High-fat meats, fish, and poultry. This includes hot dogs, ribs, ham, sausage, salami, and bacon. Dairy Whole milk and chocolate milk. Sour cream. Cream. Butter. Ice cream. Cream cheese.  Beverages Coffee and tea, with or without caffeine. Carbonated beverages or energy drinks. Condiments Hot sauce. Barbecue sauce.  Sweets/Desserts Chocolate and cocoa. Donuts. Peppermint and spearmint. Fats and Oils High-fat foods, including [place] fries and potato chips. Other Vinegar. Strong spices, such as black pepper, white pepper, red pepper, cayenne, curry powder, cloves, ginger, and chili powder. The items listed above may not be a complete list of foods and beverages to avoid. Contact your dietitian for more information.   This information is not intended to replace advice given to you by your health care provider. Make sure you discuss any questions you have with your health care provider.   Document Released: 03/13/2005 Document Revised: 04/03/2014 Document Reviewed: 01/15/2013 Elsevier Interactive Patient Education [employer] Inc2016 Elsevier Inc.

## 2016-01-18 LAB — H. PYLORI BREATH TEST: H. pylori Breath Test: NOT DETECTED

## 2016-01-24 ENCOUNTER — Telehealth: Payer: Self-pay | Admitting: *Deleted

## 2016-01-24 NOTE — Telephone Encounter (Signed)
Patient never brought speciman back.  Tried calling patient no answer/no vm

## 2016-01-24 NOTE — Telephone Encounter (Signed)
-----   Message from Doyle AskewKimberly Stephenia Harris, FNP sent at 01/23/2016  5:43 PM EDT ----- Could someone please research why this GI panel never resulted?  I thought the patient submitted a stool sample while at office visit, I could be incorrect though.  Please advise.   ----- Message ----- From: Interface, Lab In Three Zero Five Sent: 01/18/2016  11:49 AM To: Doyle AskewKimberly Stephenia Harris, FNP

## 2016-02-02 ENCOUNTER — Ambulatory Visit (INDEPENDENT_AMBULATORY_CARE_PROVIDER_SITE_OTHER): Payer: Commercial Managed Care - HMO | Admitting: Physician Assistant

## 2016-02-02 VITALS — BP 116/90 | HR 86 | Temp 98.5°F | Resp 20 | Ht 67.0 in | Wt 162.6 lb

## 2016-02-02 DIAGNOSIS — J069 Acute upper respiratory infection, unspecified: Secondary | ICD-10-CM

## 2016-02-02 DIAGNOSIS — J029 Acute pharyngitis, unspecified: Secondary | ICD-10-CM | POA: Diagnosis not present

## 2016-02-02 LAB — POCT RAPID STREP A (OFFICE): Rapid Strep A Screen: NEGATIVE

## 2016-02-02 MED ORDER — IPRATROPIUM BROMIDE 0.03 % NA SOLN: 2.0000 | Freq: Two times a day (BID) | NASAL | 0 refills | Status: DC

## 2016-02-02 MED ORDER — CETIRIZINE HCL 10 MG PO TABS: 10.0000 mg | ORAL_TABLET | Freq: Every day | ORAL | 0 refills | Status: DC

## 2016-02-02 NOTE — Progress Notes (Signed)
Anthony BaldingMorris Urbach  MRN: [id number] DOB: [birth year]  Subjective:  Anthony Kirby is a 40 y.o. male seen in office today for a chief complaint of sore throat x 2 days. Has associated rhinorrhea and headache. Denies ear pain, fatigue body aches, chills, cough, and fever. No seasonal allergies or asthma. Denies smoking or alcohol use. He has been around sick contacts at work. Pt has not tried anything for relief.    Review of Systems  HENT: Positive for sneezing. Negative for sinus pain and sinus pressure.   Gastrointestinal: Positive for nausea. Negative for diarrhea and vomiting.  Neurological: Negative for dizziness, speech difficulty, weakness, light-headedness and numbness.   Patient Active Problem List   Diagnosis Date Noted  . GERD (gastroesophageal reflux disease) 01/17/2015  . Depression 01/17/2015    Current Outpatient Prescriptions on File Prior to Visit  Medication Sig Dispense Refill  . esomeprazole (NEXIUM) 40 MG capsule Take 1 capsule (40 mg total) by mouth daily. 30 capsule 1  . sertraline (ZOLOFT) 50 MG tablet Take 50 mg by mouth daily.    . ranitidine (ZANTAC) 150 MG tablet Take 1 tablet (150 mg total) by mouth 2 (two) times daily. (Patient not taking: Reported on 02/02/2016) 60 tablet 0   No current facility-administered medications on file prior to visit.     Allergies  Allergen Reactions  . Penicillins Hives     Objective:  BP 116/90 (BP Location: Right Arm, Patient Position: Sitting, Cuff Size: Small)   Pulse 86   Temp 98.5 F (36.9 C) (Oral)   Resp 20   Ht 5\' 7"  (1.702 m)   Wt 162 lb 9.6 oz (73.8 kg)   SpO2 98%   BMI 25.47 kg/m   Physical Exam  Constitutional: He is oriented to person, place, and time and well-developed, well-nourished, and in no distress.  HENT:  Head: Normocephalic and atraumatic.  Nose: Mucosal edema and rhinorrhea present. Right sinus exhibits no maxillary sinus tenderness and no frontal sinus tenderness. Left sinus exhibits no maxillary  sinus tenderness and no frontal sinus tenderness.  Mouth/Throat: Uvula is midline and mucous membranes are normal. Posterior oropharyngeal edema present.  Eyes: Conjunctivae are normal.  Neck: Normal range of motion.  Cardiovascular: Normal rate, regular rhythm and normal heart sounds.   Pulmonary/Chest: Effort normal and breath sounds normal.  Lymphadenopathy:       Head (right side): No submental, no submandibular, no tonsillar, no preauricular, no posterior auricular and no occipital adenopathy present.       Head (left side): No submental, no submandibular, no tonsillar, no preauricular, no posterior auricular and no occipital adenopathy present.    He has no cervical adenopathy.       Right: No supraclavicular adenopathy present.       Left: No supraclavicular adenopathy present.  Neurological: He is alert and oriented to person, place, and time. He has normal sensation, normal strength, normal reflexes and intact cranial nerves. He displays normal speech. He has a normal Finger-Nose-Finger Test. Gait normal.  Skin: Skin is warm and dry.  Psychiatric: Affect normal.  Vitals reviewed.  Results for orders placed or performed in visit on 02/02/16 (from the past 24 hour(s))  POCT rapid strep A     Status: None   Collection Time: 02/02/16  2:30 PM  Result Value Ref Range   Rapid Strep A Screen Negative Negative   Assessment and Plan :  1. Sore throat - POCT rapid strep A - Culture, Group A Strep  2.  Acute upper respiratory infection -Likely viral etiology. Will treat symptomatically. Pt instructed to return if he develops worsening symptoms or new cough. Instructed to drink plenty of fluids and use OTC ibuprofen or tylenol for sore throat and headache. Follow up in one week if no improvement. Pt understands and agrees to treatment. - ipratropium (ATROVENT) 0.03 % nasal spray; Place 2 sprays into both nostrils 2 (two) times daily.  Dispense: 30 mL; Refill: 0 - cetirizine (ZYRTEC) 10 MG  tablet; Take 1 tablet (10 mg total) by mouth daily.  Dispense: 30 tablet; Refill: 0  Benjiman CoreBrittany Cina Klumpp PA-C  [facility] 02/02/2016 2:32 PM

## 2016-02-02 NOTE — Patient Instructions (Addendum)
-   We will treat this as a respiratory viral infection.  - I recommend you rest, drink plenty of fluids, eat light meals including soups.  - If you develop a cough, let me know and we can give you something for that. - You may also use Tylenol or ibuprofen over-the-counter for your sore throat and headache. You can also buy OTC throat lozenges for the sore throat. -It is also good to try hot tea with honey, lemon, ginger, and garlic for sore throat.  -Use zyrtec and nose spray for your symptoms.  -Please drink at least 64 oz of water daily to help with headache.  - Please let me know if you are not seeing any improvement or get worse in 7 days.    IF you received an x-ray today, you will receive an invoice from [facility]. Please contact [facility] at [phone] with questions or concerns regarding your invoice.   IF you received labwork today, you will receive an invoice from [facility]. Please contact [facility] at [phone] with questions or concerns regarding your invoice.   Our billing staff will not be able to assist you with questions regarding bills from these companies.  You will be contacted with the lab results as soon as they are available. The fastest way to get your results is to activate your My Chart account. Instructions are located on the last page of this paperwork. If you have not heard from us regarding the results in 2 weeks, please contact this office.

## 2016-02-04 ENCOUNTER — Telehealth: Payer: Self-pay | Admitting: Emergency Medicine

## 2016-02-04 LAB — CULTURE, GROUP A STREP: Organism ID, Bacteria: NORMAL

## 2016-02-04 NOTE — Telephone Encounter (Signed)
-----   Message from Magdalene RiverBrittany D Wiseman, PA-C sent at 02/04/2016 11:18 AM EST ----- Please call patient and let them know their strep culture was negative. Thank you!

## 2016-06-08 ENCOUNTER — Ambulatory Visit (INDEPENDENT_AMBULATORY_CARE_PROVIDER_SITE_OTHER): Payer: BLUE CROSS/BLUE SHIELD | Admitting: Family Medicine

## 2016-06-08 VITALS — BP 120/90 | HR 74 | Temp 98.5°F | Resp 16 | Ht 65.5 in | Wt 160.4 lb

## 2016-06-08 DIAGNOSIS — K2970 Gastritis, unspecified, without bleeding: Secondary | ICD-10-CM

## 2016-06-08 DIAGNOSIS — K2990 Gastroduodenitis, unspecified, without bleeding: Secondary | ICD-10-CM

## 2016-06-08 MED ORDER — OMEPRAZOLE 40 MG PO CPDR: 40.0000 mg | DELAYED_RELEASE_CAPSULE | Freq: Every day | ORAL | 3 refills | Status: DC

## 2016-06-08 NOTE — Progress Notes (Signed)
   Anthony Kirby is a 41 y.o. male who presents to Primary Care at [facility] today for epigastric pain:  1.  Epigastric pain: Present for several years. In the past this has been controlled with ranitidine. He has been tested for H. pylori and has been negative. Evidently at some point he was prescribed Nexium but sounds like he never picked this up.  His been out of his ranitidine for about 2 months. He states that he noticed some worsening epigastric pain and brash especially early in the morning and especially worse with spicy food. He is not tried anything for relief. Occasionally has mild nausea. His never had any vomiting. No melena. Hematochezia.  His had an EGD which showed gastritis. He is not taking any NSAIDs. He has no chest pain. No dyspnea. No palpitations. No fevers or chills.  He does have known elevated LFTs mildly. His never had testing for hepatitis.  ROS as above.    PMH reviewed. Patient is a nonsmoker.   Past Medical History:  Diagnosis Date  . Elevated LFTs   . Gastritis   . GERD (gastroesophageal reflux disease)    No past surgical history on file.  Medications reviewed. Current Outpatient Prescriptions  Medication Sig Dispense Refill  . sertraline (ZOLOFT) 50 MG tablet Take 50 mg by mouth daily.    . cetirizine (ZYRTEC) 10 MG tablet Take 1 tablet (10 mg total) by mouth daily. (Patient not taking: Reported on 06/08/2016) 30 tablet 0  . esomeprazole (NEXIUM) 40 MG capsule Take 1 capsule (40 mg total) by mouth daily. (Patient not taking: Reported on 06/08/2016) 30 capsule 1  . ipratropium (ATROVENT) 0.03 % nasal spray Place 2 sprays into both nostrils 2 (two) times daily. (Patient not taking: Reported on 06/08/2016) 30 mL 0  . ranitidine (ZANTAC) 150 MG tablet Take 1 tablet (150 mg total) by mouth 2 (two) times daily. (Patient not taking: Reported on 02/02/2016) 60 tablet 0   No current facility-administered medications for this visit.      Physical Exam:  BP 110/90    Pulse 74   Temp 98.5 F (36.9 C) (Oral)   Resp 16   Ht 5' 5.5" (1.664 m)   Wt 160 lb 6.4 oz (72.8 kg)   SpO2 97%   BMI 26.29 kg/m  Gen:  Alert, cooperative patient who appears stated age in no acute distress.  Vital signs reviewed. HEENT: EOMI,  MMM.  No pallor noted. Pulm:  Clear to auscultation bilaterally with good air movement.    Cardiac:  Regular rate and rhythm without murmur auscultated.   Abd:  Soft/nondistended.  Tenderness to palpation in epigastrium and left upper quadrant. Mild and only with deep palpation. No guarding or rebound. Good bowel sounds throughout. Exts: Non edematous BL  LE, warm and well perfused.   Assessment and Plan:  1.  Gastritis: -With known EGD diagnosis of this in the past. -He is also not taking any aspirin. -No triggering factors. -We'll treat with omeprazole. -Follow-up in 6 weeks to assess for improvement and any need for continued therapy. Sooner if worsening.  2.  Elevated LFTs: - He has chronically elevated ALT. - checking today as well as hepatitis.  Likely not contributing to #1 above.

## 2016-06-08 NOTE — Patient Instructions (Addendum)
Take the omeprazole daily for the next 6 weeks to see if we can control her symptoms.  We are going to check your blood today as well likely discussed.  If you start having any worsening pain, vomiting, losing weight come back and see us.   Gastritis en los adultos (Gastritis, Adult) La gastritis es la irritacin del estmago. Hay dos tipos de gastritis:  Gastritis aguda. Este tipo aparece de manera repentina.  Gastritis crnica. Este tipo dura Con-waymucho tiempo. La gastritis aparece cuando la membrana que recubre el estmago se debilita o se daa. Sin tratamiento, la gastritis puede causar hemorragias y lceras estomacales. CAUSAS Esta afeccin puede ser causada por lo siguiente:  Una infeccin.  Beber alcohol en exceso.  Ciertos medicamentos.  Tener demasiada cantidad de cido Bank of Americaen el estmago.  Una enfermedad de los intestinos o del Wilmotestmago.  Estrs. SNTOMAS Los sntomas de esta afeccin incluyen lo siguiente:  Dolor o ardor en la parte superior del abdomen.  Nuseas.  Vmitos.  Sensacin molesta de distensin despus de comer. En algunos casos no hay sntomas. DIAGNSTICO Esta afeccin se puede diagnosticar a travs de lo siguiente:  Una descripcin de los sntomas.  Un examen fsico.  Estudios. Estos pueden incluir los siguientes:  Anlisis de Foristellsangre.  Pruebas de materia fecal.  Neomia DearUna prueba en la que se introduce un instrumento delgado y flexible que tiene una luz y Neomia Dearuna cmara en la punta a travs del esfago y [place]acia el estmago (endoscopia superior).  Una prueba en la que se toma una muestra de tejido para Public librariananalizarlo (biopsia). TRATAMIENTO Esta afeccin puede tratarse con medicamentos. Si la afeccin es causada por una infeccin bacteriana, pueden darle antibiticos. Si es causada por demasiada cantidad de cido en el estmago, pueden darle medicamentos llamados bloqueadores H2, inhibidores de la bomba de protones o anticidos. El tratamiento tambin puede  incluir la suspensin del uso de ciertos medicamentos, como la aspirina, el ibuprofeno u otros antiinflamatorios no esteroides (AINE). INSTRUCCIONES PARA EL CUIDADO EN EL HOGAR  Tome los medicamentos de venta libre y los recetados solamente como se lo haya indicado el mdico.  Si le recetaron un antibitico, tmelo como se lo haya indicado el mdico. No deje de tomar los antibiticos aunque comience a Actorsentirse mejor.  Beba suficiente lquido para Photographermantener la orina clara o de color amarillo plido.  Haga varias comidas pequeas y frecuentes Freight forwarderdurante el da en lugar de comidas abundantes. SOLICITE ATENCIN MDICA SI:  Los sntomas empeoran.  Los sntomas regresan despus del tratamiento. SOLICITE ATENCIN MDICA DE INMEDIATO SI:  Vomita sangre de color rojo brillante o una sustancia similar a los granos de caf.  La materia fecal es negra o de color rojo oscuro.  No puede retener los lquidos.  El dolor abdominal empeora.  Tiene fiebre.  No mejora luego de 1 semana. Esta informacin no tiene Theme park managercomo fin reemplazar el consejo del mdico. Asegrese de hacerle al mdico cualquier pregunta que tenga. Document Released: 12/21/2004 Document Revised: 12/02/2014 Document Reviewed: 12/05/2014 Elsevier Interactive Patient Education  2017 [employer]     IF you received an x-ray today, you will receive an invoice from [facility]. Please contact [facility] at [phone] with questions or concerns regarding your invoice.   IF you received labwork today, you will receive an invoice from [facility]. Please contact [facility] at [phone] with questions or concerns regarding your invoice.   Our billing staff will not be able to assist you with questions regarding bills from these companies.  You will be contacted with the lab results as soon as they are available. The fastest way to get your results is to activate your My Chart account. Instructions are located on the last  page of this paperwork. If you have not heard from [place] regarding the results in 2 weeks, please contact this office.

## 2016-06-09 LAB — HEPATITIS PANEL, ACUTE
Hep A IgM: NEGATIVE
Hep B C IgM: NEGATIVE
Hep C Virus Ab: <0.1 s/co ratio (ref 0.0–0.9)
Hepatitis B Surface Ag: NEGATIVE

## 2016-06-09 LAB — CBC
Hematocrit: 46.3 % (ref 37.5–51.0)
Hemoglobin: 16.2 g/dL (ref 13.0–17.7)
MCH: 31 pg (ref 26.6–33.0)
MCHC: 35 g/dL (ref 31.5–35.7)
MCV: 89 fL (ref 79–97)
Platelets: 257 10*3/uL (ref 150–379)
RBC: 5.23 x10E6/uL (ref 4.14–5.80)
RDW: 13.2 % (ref 12.3–15.4)
WBC: 7.4 10*3/uL (ref 3.4–10.8)

## 2016-06-09 LAB — COMPREHENSIVE METABOLIC PANEL
ALT: 40 IU/L (ref 0–44)
AST: 21 IU/L (ref 0–40)
Albumin/Globulin Ratio: 2 (ref 1.2–2.2)
Albumin: 4.9 g/dL (ref 3.5–5.5)
Alkaline Phosphatase: 65 IU/L (ref 39–117)
BUN/Creatinine Ratio: 19 (ref 9–20)
BUN: 14 mg/dL (ref 6–24)
Bilirubin Total: 0.3 mg/dL (ref 0.0–1.2)
CO2: 30 mmol/L — ABNORMAL HIGH (ref 18–29)
Calcium: 10 mg/dL (ref 8.7–10.2)
Chloride: 97 mmol/L (ref 96–106)
Creatinine, Ser: 0.75 mg/dL — ABNORMAL LOW (ref 0.76–1.27)
GFR calc Af Amer: 133 mL/min/{1.73_m2} (ref >59)
GFR calc non Af Amer: 115 mL/min/{1.73_m2} (ref >59)
Globulin, Total: 2.5 g/dL (ref 1.5–4.5)
Glucose: 101 mg/dL — ABNORMAL HIGH (ref 65–99)
Potassium: 4.1 mmol/L (ref 3.5–5.2)
Sodium: 139 mmol/L (ref 134–144)
Total Protein: 7.4 g/dL (ref 6.0–8.5)

## 2016-06-13 ENCOUNTER — Telehealth: Payer: Self-pay | Admitting: Emergency Medicine

## 2016-06-13 NOTE — Telephone Encounter (Signed)
-----   Message from Jeffrey H Walden, MD sent at 06/09/2016 10:05 AM EDT ----- Mr. Anthony Kirby previously had elevated liver tests.  However now they are completly normal.  We also tested him for hepatitis, which was negative.  This is great news.  Please call and let him know.  Thanks, JW 

## 2016-06-13 NOTE — Telephone Encounter (Signed)
-----   Message from Tobey GrimJeffrey H Walden, MD sent at 06/09/2016 10:05 AM EDT ----- Mr. Langston MaskerMorris previously had elevated liver tests.  However now they are completly normal.  We also tested him for hepatitis, which was negative.  This is great news.  Please call and let him know.  Thanks, JW

## 2016-07-19 ENCOUNTER — Ambulatory Visit (INDEPENDENT_AMBULATORY_CARE_PROVIDER_SITE_OTHER): Payer: BLUE CROSS/BLUE SHIELD | Admitting: Family Medicine

## 2016-07-19 VITALS — BP 128/86 | HR 84 | Temp 98.6°F | Resp 18 | Ht 65.5 in | Wt 167.0 lb

## 2016-07-19 DIAGNOSIS — K219 Gastro-esophageal reflux disease without esophagitis: Secondary | ICD-10-CM | POA: Diagnosis not present

## 2016-07-19 DIAGNOSIS — F32A Depression, unspecified: Secondary | ICD-10-CM

## 2016-07-19 DIAGNOSIS — F329 Major depressive disorder, single episode, unspecified: Secondary | ICD-10-CM | POA: Diagnosis not present

## 2016-07-19 MED ORDER — OMEPRAZOLE 40 MG PO CPDR: 40.0000 mg | DELAYED_RELEASE_CAPSULE | Freq: Every day | ORAL | 3 refills | Status: DC

## 2016-07-19 MED ORDER — SERTRALINE HCL 50 MG PO TABS: 25.0000 mg | ORAL_TABLET | Freq: Every day | ORAL | 3 refills | Status: DC

## 2016-07-19 NOTE — Progress Notes (Signed)
   Anthony Kirby is a 41 y.o. male who presents to Primary Care at [facility] today for GERD:  1.  GERD:  Much improved since starting Omeprazole daily.  Still has very mild epigastric burning after certain meals this is mostly hot chili peppers or coffee. He has tried cutting his coffee with her about a milk and this helps some. He does not smoke or drink alcohol. Takes omeprazole daily. No melena. No weight loss.  2.  Depression:   Ongoing issue for patient. He takes sertraline for this. Started on this while he was still in [place]. She takes half of the 50 mg has been prescribed. Feels this is controlling his symptoms. Denies any anxiety. No suicidal or homicidal ideation. Has good support system at home.  ROS as above.  Pertinently, no chest pain, palpitations, SOB.  No N/V/D.  PMH reviewed. Patient is a nonsmoker.   Past Medical History:  Diagnosis Date  . Elevated LFTs   . Gastritis   . GERD (gastroesophageal reflux disease)    No past surgical history on file.  Medications reviewed. Current Outpatient Prescriptions  Medication Sig Dispense Refill  . sertraline (ZOLOFT) 50 MG tablet Take 50 mg by mouth daily.    . cetirizine (ZYRTEC) 10 MG tablet Take 1 tablet (10 mg total) by mouth daily. (Patient not taking: Reported on 06/08/2016) 30 tablet 0  . esomeprazole (NEXIUM) 40 MG capsule Take 1 capsule (40 mg total) by mouth daily. (Patient not taking: Reported on 06/08/2016) 30 capsule 1  . ipratropium (ATROVENT) 0.03 % nasal spray Place 2 sprays into both nostrils 2 (two) times daily. (Patient not taking: Reported on 06/08/2016) 30 mL 0  . omeprazole (PRILOSEC) 40 MG capsule Take 1 capsule (40 mg total) by mouth daily. (Patient not taking: Reported on 07/19/2016) 30 capsule 3  . ranitidine (ZANTAC) 150 MG tablet Take 1 tablet (150 mg total) by mouth 2 (two) times daily. (Patient not taking: Reported on 02/02/2016) 60 tablet 0   No current facility-administered medications for this visit.       Physical Exam:  BP 128/86   Pulse 84   Temp 98.6 F (37 C) (Oral)   Resp 18   Ht 5' 5.5" (1.664 m)   Wt 167 lb (75.8 kg)   SpO2 97%   BMI 27.37 kg/m  Gen:  Alert, cooperative patient who appears stated age in no acute distress.  Vital signs reviewed. HEENT: EOMI,  MMM Pulm:  Clear Heart: RRR Abd:  Soft/nondistended.  Very minimal tenderness epigastrum.  No guarding/rebound Psych:  Not depressed or anxious appearing.  Linear and coherent thought process as evidenced by speech pattern. Smiles spontaneously.    Assessment and Plan:  1.  GERD:   - refill for omeprazole today.  - has been on this for 6 weeks.  - FU in 3 months.  May initiate trial off of PPI at that point  2.  Depression: - stable - good support system at home - refill for 25 mg sertraline.  - FU in 3 months

## 2016-07-19 NOTE — Patient Instructions (Addendum)
(  Could not print AVS as power surged and we lost Wi-fi.  Patient left)    IF you received an x-ray today, you will receive an invoice from [facility]. Please contact [facility] at [phone] with questions or concerns regarding your invoice.   IF you received labwork today, you will receive an invoice from [place]. Please contact [facility] at [phone] with questions or concerns regarding your invoice.   Our billing staff will not be able to assist you with questions regarding bills from these companies.  You will be contacted with the lab results as soon as they are available. The fastest way to get your results is to activate your My Chart account. Instructions are located on the last page of this paperwork. If you have not heard from [place] regarding the results in 2 weeks, please contact this office.

## 2016-09-19 ENCOUNTER — Encounter: Payer: Self-pay | Admitting: Family Medicine

## 2016-09-19 ENCOUNTER — Ambulatory Visit (INDEPENDENT_AMBULATORY_CARE_PROVIDER_SITE_OTHER): Payer: BLUE CROSS/BLUE SHIELD | Admitting: Family Medicine

## 2016-09-19 VITALS — BP 111/72 | HR 71 | Temp 98.9°F | Resp 16 | Ht 65.5 in | Wt 164.4 lb

## 2016-09-19 DIAGNOSIS — K219 Gastro-esophageal reflux disease without esophagitis: Secondary | ICD-10-CM

## 2016-09-19 DIAGNOSIS — G4452 New daily persistent headache (NDPH): Secondary | ICD-10-CM | POA: Diagnosis not present

## 2016-09-19 DIAGNOSIS — R110 Nausea: Secondary | ICD-10-CM

## 2016-09-19 DIAGNOSIS — R1011 Right upper quadrant pain: Secondary | ICD-10-CM

## 2016-09-19 DIAGNOSIS — R5383 Other fatigue: Secondary | ICD-10-CM | POA: Diagnosis not present

## 2016-09-19 MED ORDER — OMEPRAZOLE 40 MG PO CPDR: 40.0000 mg | DELAYED_RELEASE_CAPSULE | Freq: Every day | ORAL | 1 refills | Status: DC

## 2016-09-19 NOTE — Progress Notes (Signed)
By signing my name below, I, Anthony Kirby, attest that this documentation has been prepared under the direction and in the presence of Anthony Staggers, MD.  Electronically Signed: Arvilla Market, Medical Scribe. 09/19/16. 5:36 PM.  Subjective:    Patient ID: Anthony Kirby, male    DOB: 1975-05-22, 41 y.o.   MRN: [id number]  HPI Chief Complaint  Patient presents with  . Abdominal Pain    R upper quad, with heartburn; hx of GERD  . Fatigue  . Headache    HPI Comments: Anthony Kirby is a 41 y.o. male who presents to Primary Care at [facility] complaining of multiple concerns including RUQ abdominal pain for the last 3 years, but worsening this last weekend. He has a history of GERD and elevated LFTs. I saw him in 2016, and 2017, but he has been seen by Dr. Gwendolyn Grant in March and April. When he was seen in March reported epigastric pain for years. Treated previously with zantac. He was out of zantac for 2 months at that visit, noticed worsening epigastric pain and water brash in the morning and with spicy foods. He was started on omeprazole QD for 6 weeks. Denies NSAIDs no other red flag sxs. He was seen in follow-up  Reports associated sxs of nausea, sleep loss from abdominal pain, diarrhea 1x a week, fatigue for the past month, constant HA, and "sour saliva" in the morning. Pt has been having a intermittent light to severe HAs with occasional warm ears for the past year and the pains stays the same. He'll take tylenol about 1x a week for his HA. Located his pain underneath his right rib and points to RUQ. Pt was taking and ranitidine and omeprazole daily with little relief his abdominal pain. Pt ran out of omeprazole yesterday. Pt drinks about 2-3 cups of coffee in the morning. Pt has had an abdominal US in the last 4 years. Pt is complaint with zoloft. Pt is not smoking or drinking. Denies emesis, bloody stool, melena, extremity weakness, depression, anxious, cough, chest pain, eating spicy foods,  congestion, rhinorrhea, sneezing, and SOB.  Elevated LFTs: Was nl in March and he had a negative acute Hepatitis panel. He also had a nl CBC and CNP in March, except borderline glucose of 101 Lab Results  Component Value Date   ALT 40 06/08/2016   AST 21 06/08/2016   ALKPHOS 65 06/08/2016   BILITOT 0.3 06/08/2016     Patient Active Problem List   Diagnosis Date Noted  . GERD (gastroesophageal reflux disease) 01/17/2015  . Depression 01/17/2015   Past Medical History:  Diagnosis Date  . Depression   . Elevated LFTs   . Gastritis   . GERD (gastroesophageal reflux disease)    No past surgical history on file. Allergies  Allergen Reactions  . Penicillins Hives   Prior to Admission medications   Medication Sig Start Date End Date Taking? Authorizing Provider  omeprazole (PRILOSEC) 40 MG capsule Take 1 capsule (40 mg total) by mouth daily. 07/19/16  Yes Tobey Grim, MD  sertraline (ZOLOFT) 50 MG tablet Take 0.5 tablets (25 mg total) by mouth daily. 07/19/16  Yes Tobey Grim, MD   Social History   Social History  . Marital status: Single    Spouse name: N/A  . Number of children: N/A  . Years of education: N/A   Occupational History  . Not on file.   Social History Main Topics  . Smoking status: Never Smoker  . Smokeless tobacco:  Never Used  . Alcohol use No  . Drug use: No  . Sexual activity: Not on file   Other Topics Concern  . Not on file   Social History Narrative  . No narrative on file   Review of Systems  Constitutional: Positive for fatigue.  HENT: Negative for congestion, rhinorrhea and sneezing.   Respiratory: Negative for cough and shortness of breath.   Cardiovascular: Negative for chest pain.  Gastrointestinal: Positive for diarrhea and nausea. Negative for blood in stool and vomiting.  Neurological: Positive for headaches. Negative for weakness.  Psychiatric/Behavioral: Positive for sleep disturbance. Negative for dysphoric mood. The  patient is not nervous/anxious.    Objective:  Physical Exam  Constitutional: He appears well-developed and well-nourished. No distress.  HENT:  Head: Normocephalic and atraumatic.  Nose: Right sinus exhibits no maxillary sinus tenderness and no frontal sinus tenderness. Left sinus exhibits no maxillary sinus tenderness and no frontal sinus tenderness.  Sinuses non tender  Eyes: Conjunctivae and EOM are normal. Pupils are equal, round, and reactive to light. Right eye exhibits no nystagmus. Left eye exhibits no nystagmus.  Neck: Normal range of motion. Neck supple.  Pain free c-spine  Cardiovascular: Normal rate, regular rhythm and normal heart sounds.  Exam reveals no gallop and no friction rub.   No murmur heard. Pulmonary/Chest: Effort normal and breath sounds normal. No respiratory distress. He has no wheezes. He has no rales.  Abdominal: He exhibits no distension. There is tenderness (slight) in the right upper quadrant. There is no rebound and no guarding.  Slight discomfort with Eulah Pont but does not stop breath  Neurological: He is alert. He displays a negative Romberg sign.  No pronator drift  Skin: Skin is warm and dry.  Psychiatric: He has a normal mood and affect. His behavior is normal.  Nursing note and vitals reviewed.   Vitals:   09/19/16 1701  BP: 111/72  Pulse: 71  Resp: 16  Temp: 98.9 F (37.2 C)  TempSrc: Oral  SpO2: 97%  Weight: 164 lb 6.4 oz (74.6 kg)  Height: 5' 5.5" (1.664 m)  Body mass index is 26.94 kg/m. Assessment & Plan:    Anthony Kirby is a 41 y.o. male RUQ abdominal pain - Plan: omeprazole (PRILOSEC) 40 MG capsule, Comprehensive metabolic panel, Lipase, US Abdomen Limited RUQ Gastroesophageal reflux disease, esophagitis presence not specified Nausea without vomiting - Plan: Comprehensive metabolic panel, Lipase  - Check CMP, lipase to evaluate liver/pancreas cause. We'll also check right upper quadrant ultrasound, continue omeprazole same dose.  Consider GI eval depending on results  - Trigger foods listed on handout  Other fatigue - Plan: Comprehensive metabolic panel, CBC  - CBC, CMP ordered. RTC precautions if symptoms persist.  New daily persistent headache - Plan: AMB referral to headache clinic  -New daily headache, refer to neurology. Nonfocal neurologic exam. rtc precautions.   Meds ordered this encounter  Medications  . omeprazole (PRILOSEC) 40 MG capsule    Sig: Take 1 capsule (40 mg total) by mouth daily.    Dispense:  30 capsule    Refill:  1   Patient Instructions   I will check blood tests for fatigue and your abdominal pain, and schedule a ultrasound to look at liver and gallbladder. I will also refer you to a headache specialist to discuss your daily headaches.   Return to the clinic or go to the nearest emergency room if any of your symptoms worsen or new symptoms occur. Opciones de alimentos para  pacientes con reflujo gastroesofgico - Adultos (Food Choices for Gastroesophageal Reflux Disease, Adult) Cuando se tiene reflujo gastroesofgico (ERGE), los alimentos que se ingieren y los hbitos de alimentacin son muy importantes. Elegir los alimentos adecuados puede ayudar a Paramedic las molestias ocasionadas por el [place]. QU PAUTAS GENERALES DEBO SEGUIR?  Elija las frutas, los vegetales, los cereales integrales, los productos lcteos, la carne de [place], de pescado y de ave con bajo contenido de grasas.  Limite las grasas, [address] [place], los aderezos para [place], la [place], los frutos secos y Programme researcher, broadcasting/film/video.  Lleve un registro de las comidas para identificar los alimentos que ocasionan sntomas.  Evite los alimentos que le ocasionen reflujo. Pueden ser distintos para cada persona.  Haga comidas pequeas con frecuencia en lugar de tres comidas [employer].  Coma lentamente, en un clima distendido.  Limite el consumo de alimentos fritos.  Cocine los alimentos utilizando mtodos que no sean  la fritura.  Evite el consumo alcohol.  Evite beber grandes cantidades de lquidos con las comidas.  Evite agacharse o recostarse hasta despus de 2 o 3horas de haber comido.  QU ALIMENTOS NO SE RECOMIENDAN? Los siguientes son algunos alimentos y bebidas que pueden empeorar los sntomas: Veterinary surgeon. Jugo de tomate. Salsa de tomate y espagueti. Ajes. Cebolla y [place]. Rbano picante. Frutas Naranjas, pomelos y limn (fruta y [place]). Carnes Carnes de [employer], de pescado y de ave con gran contenido de grasas. Esto incluye los perros calientes, las [place], el [place], la salchicha, el salame y el tocino. Lcteos [place] entera y [place]. [employer]. Crema. [place]. Helados. Queso crema. Bebidas Caf y t negro, con o sin cafena Bebidas gaseosas o energizantes. Condimentos Salsa picante. Salsa barbacoa. Dulces/postres Chocolate y cacao. Rosquillas. Menta y mentol. Grasas y [employer] con alto contenido de grasas, incluidas las papas fritas. Otros Vinagre. Especias picantes, como la [employer], la pimienta blanca, la pimienta roja, la pimienta de cayena, el curry en [place], los clavos de [place], el jengibre y el [place] en polvo. Los artculos mencionados arriba pueden no ser [employer]on de las bebidas y los alimentos que se Theatre stage manager. Comunquese con el nutricionista para recibir ms informacin. Esta informacin no tiene Theme park manager el consejo del mdico. Asegrese de hacerle al mdico cualquier pregunta que tenga. Document Released: 12/21/2004 Document Revised: 04/03/2014 Document Reviewed: 01/15/2013 Elsevier Interactive Patient Education  2017 [employer].   [place] (Fatigue) La fatiga es una sensacin de cansancio en todo momento, falta de energa o falta de motivacin. La fatiga ocasional o leve con frecuencia es una reaccin normal a la actividad o la vida en general. Sin embargo, la fatiga de Set designer duracin (crnica) o extrema puede  indicar una enfermedad preexistente. INSTRUCCIONES PARA EL CUIDADO EN EL HOGAR Controle su fatiga para ver si hay cambios. Las siguientes indicaciones ayudarn a Psychologist, educational [employer] pueda sentir:  Hable con el mdico acerca de cunto debe dormir cada noche. Trate de dormir la cantidad de tiempo requerida todas las noches.  Tome los medicamentos solamente como se lo haya indicado el mdico.  Siga una dieta saludable y nutritiva. Pida ayuda al mdico si necesita hacer cambios en su dieta.  Beba suficiente lquido para Photographer orina clara o de color amarillo plido.  Practique actividades que lo relajen, como yoga, meditacin, terapia de [place] o acupuntura.  Haga ejercicios regularmente.  Cambie las situaciones que le provocan estrs. Trate de que su [place] y personal  sea moderada.  No consuma drogas.  Limite el consumo de alcohol a no ms de 1 medida por da si es mujer y no est Orthoptist, y 2 medidas si es hombre. Una medida equivale a 12onzas de cerveza, 5onzas de vino o 1onzas de bebidas alcohlicas de alta graduacin.  Tome una multivitamina, si se lo indic el mdico. SOLICITE ATENCIN MDICA SI:  La fatiga no mejora.  Tiene fiebre.  Tiene prdida o aumento involuntario de [place].  Tiene dolores de [place].  Tiene dificultad para: ? Dormirse. ? Dormir durante toda la noche.  Se siente enojado, culpable, ansioso o triste.  No puede defecar (estreimiento).  Tiene la piel seca.  Tiene hinchadas las piernas u otra parte del cuerpo. SOLICITE ATENCIN MDICA DE INMEDIATO SI:  Se siente confundido.  Tiene visin borrosa.  Sufre mareos o se desmaya.  Sufre un dolor intenso de [place].  Siente dolor intenso en el abdomen, la pelvis o la espalda.  Tiene dolor de pecho, dificultad para respirar, o latidos cardacos irregulares o acelerados.  No puede orinar u Omnicare de lo normal.  Presenta sangrado anormal, como sangrado del  recto, la vagina, la nariz, los pulmones o los pezones.  Vomita sangre.  Tiene pensamientos acerca de hacerse dao a s mismo o cometer suicidio.  Le preocupa la posibilidad de hacerle dao a otra persona. Esta informacin no tiene Theme park manager el consejo del mdico. Asegrese de hacerle al mdico cualquier pregunta que tenga. Document Released: 06/29/2008 Document Revised: 07/05/2015 Document Reviewed: 07/15/2013 Elsevier Interactive Patient Education  2018 [employer].  Dolor abdominal en adultos Abdominal Pain, Adult El dolor abdominal puede tener muchas causas. A menudo, no es grave y [place] sin tratamiento o con tratamiento en la casa. Sin embargo, a Facilities manager abdominal es intenso. El mdico revisar sus antecedentes mdicos y le har un examen fsico para tratar de Production assistant, radio causa del dolor abdominal. Siga estas instrucciones en su casa:  Tome los medicamentos de venta libre y los recetados solamente como se lo haya indicado el mdico. No tome un laxante a menos que se lo haya indicado el mdico.  Beba suficiente lquido para Pharmacologist la orina clara o de color amarillo plido.  Controle su afeccin para ver si hay cambios.  Concurra a todas las visitas de control como se lo haya indicado el mdico. Esto es importante. Comunquese con un mdico si:  El dolor abdominal cambia o empeora.  No tiene apetito o baja de peso sin proponrselo.  Est estreido o tiene diarrea durante ms de 2 o 3das.  Tiene dolor cuando orina o defeca.  El dolor abdominal lo despierta de noche.  El dolor empeora con las comidas, despus de comer o con determinados alimentos.  Tiene vmitos y no puede retener nada.  Tiene fiebre. Solicite ayuda de inmediato si:  El dolor no desaparece tan pronto como el mdico le dijo que era esperable.  No puede detener los vmitos.  El Engineer, mining se siente solo en zonas del abdomen, como el lado derecho o la parte inferior izquierda del  abdomen.  Las heces son sanguinolentas o de color negro, o de aspecto alquitranado.  Tiene dolor intenso, clicos, o meteorismo en el abdomen.  Tiene signos de deshidratacin, por ejemplo: ? [place], muy escasa o falta de [place]. ? Labios agrietados. ? [employer].D.C. Holdings. ? Ojos hundidos. ? Somnolencia. ? Debilidad. Esta informacin no tiene Theme park manager el consejo del mdico. Asegrese de hacerle al mdico cualquier  pregunta que tenga. Document Released: 03/13/2005 Document Revised: 03/02/2016 Document Reviewed: 08/25/2015 Elsevier Interactive Patient Education  2017 Elsevier Inc.  Dolor de cabeza general sin causa (General Headache Without Cause) El dolor de cabeza es un dolor o Dentist que se siente en la zona de la cabeza o del cuello. Puede no tener una causa especfica. Hay muchas causas y tipos de dolores de [place]. Los dolores de cabeza ms comunes son los siguientes:  Cefalea tensional.  Cefaleas migraosas.  Cefalea en brotes.  Cefaleas diarias crnicas. INSTRUCCIONES PARA EL CUIDADO EN EL HOGAR Controle su afeccin para ver si hay cambios. Siga estos pasos para Scientist, physiological afeccin: Control del [employer] American medicamentos de venta libre y los recetados solamente como se lo haya indicado el mdico.  Cuando sienta dolor de cabeza acustese en un cuarto oscuro y tranquilo.  Si se lo indican, aplique hielo sobre la cabeza y la zona del cuello: ? Ponga el hielo en una bolsa plstica. ? Coloque una FirstEnergy Corp piel y la bolsa de hielo. ? Coloque el hielo durante , 2 a 3veces por da.  Utilice una almohadilla trmica o tome una ducha con agua caliente para aplicar calor en la cabeza y la zona del cuello como se lo haya indicado el mdico.  Mantenga las luces tenues si le Liz Claiborne luces brillantes o sus dolores de cabeza empeoran. Comida y bebida  Mantenga un horario para las comidas.  Limite el consumo de bebidas alcohlicas.  Consuma  menos cantidad de cafena o deje de tomarla. Instrucciones generales  Concurra a todas las visitas de control como se lo haya indicado el mdico. Esto es importante.  Lleve un diario de los dolores de cabeza para Financial risk analyst qu factores pueden desencadenarlos. Por ejemplo, escriba los siguientes datos: ? Lo que usted come y bebe. ? Cunto tiempo duerme. ? Algn cambio en su dieta o en los medicamentos.  Pruebe algunas tcnicas de relajacin, como los [place].  Limite el estrs.  Sintese con la espalda recta y no tense los msculos.  No consuma productos que contengan tabaco, incluidos cigarrillos, tabaco de Theatre manager o cigarrillos electrnicos. Si necesita ayuda para dejar de fumar, consulte al mdico.  Haga actividad fsica habitualmente como se lo haya indicado el mdico.  Tenga un horario fijo para dormir. Duerma entre 7 y 9horas o la cantidad de horas que le haya recomendado el mdico. SOLICITE ATENCIN MDICA SI:  Los medicamentos no Materials engineer los sntomas.  Tiene un dolor de cabeza que es diferente del dolor de cabeza habitual.  Tiene nuseas o vmitos.  Tiene fiebre.  SOLICITE ATENCIN MDICA DE INMEDIATO SI:  El dolor se hace cada vez ms intenso.  Ha vomitado repetidas veces.  Presenta rigidez en el cuello.  Sufre prdida de la visin.  Tiene problemas para hablar.  Siente dolor en el ojo o en el odo.  Presenta debilidad muscular o prdida del control muscular.  Pierde el equilibrio o tiene problemas para Advertising account planner.  Sufre mareos o se desmaya.  Se siente confundido.  Esta informacin no tiene Theme park manager el consejo del mdico. Asegrese de hacerle al mdico cualquier pregunta que tenga. Document Released: 12/21/2004 Document Revised: 12/02/2014 Document Reviewed: 07/06/2014 Elsevier Interactive Patient Education  2017 [employer].      IF you received an x-ray today, you will receive an invoice from [facility]. Please contact  [facility] at [phone] with questions or concerns regarding your invoice.   IF you received labwork  today, you will receive an invoice from [facility]. Please contact [facility] at [phone] with questions or concerns regarding your invoice.   Our billing staff will not be able to assist you with questions regarding bills from these companies.  You will be contacted with the lab results as soon as they are available. The fastest way to get your results is to activate your My Chart account. Instructions are located on the last page of this paperwork. If you have not heard from us regarding the results in 2 weeks, please contact this office.       I personally performed the services described in this documentation, which was scribed in my presence. The recorded information has been reviewed and considered for accuracy and completeness, addended by me as needed, and agree with information above.  Signed,   Anthony StaggersJeffrey Cathline Dowen, MD [facility].  09/23/16 5:27 PM

## 2016-09-19 NOTE — Patient Instructions (Signed)
I will check blood tests for fatigue and your abdominal pain, and schedule a ultrasound to look at liver and gallbladder. I will also refer you to a headache specialist to discuss your daily headaches.   Return to the clinic or go to the nearest emergency room if any of your symptoms worsen or new symptoms occur. Opciones de alimentos para pacientes con reflujo gastroesofgico - Adultos (Food Choices for Gastroesophageal Reflux Disease, Adult) Cuando se tiene reflujo gastroesofgico (ERGE), los alimentos que se ingieren y los hbitos de alimentacin son muy importantes. Elegir los alimentos adecuados puede ayudar a Paramedic las molestias ocasionadas por el [place]. QU PAUTAS GENERALES DEBO SEGUIR?  Elija las frutas, los vegetales, los cereales integrales, los productos lcteos, la carne de [place], de pescado y de ave con bajo contenido de grasas.  Limite las grasas, [address] [place], los aderezos para [place], la [place], los frutos secos y Programme researcher, broadcasting/film/video.  Lleve un registro de las comidas para identificar los alimentos que ocasionan sntomas.  Evite los alimentos que le ocasionen reflujo. Pueden ser distintos para cada persona.  Haga comidas pequeas con frecuencia en lugar de tres comidas OfficeMax Incorporated.  Coma lentamente, en un clima distendido.  Limite el consumo de alimentos fritos.  Cocine los alimentos utilizando mtodos que no sean la fritura.  Evite el consumo alcohol.  Evite beber grandes cantidades de lquidos con las comidas.  Evite agacharse o recostarse hasta despus de 2 o 3horas de haber comido.  QU ALIMENTOS NO SE RECOMIENDAN? Los siguientes son algunos alimentos y bebidas que pueden empeorar los sntomas: Veterinary surgeon. Jugo de tomate. Salsa de tomate y espagueti. Ajes. Cebolla y [place]. Rbano picante. Frutas Naranjas, pomelos y limn (fruta y [place]). Carnes Carnes de [place], de pescado y de ave con gran contenido de grasas. Esto incluye los perros  calientes, las [place], el [place], la salchicha, el salame y el tocino. Lcteos Leche entera y Schneider. PPG Industries. Crema. Mantequilla. Helados. Queso crema. Bebidas Caf y t negro, con o sin cafena Bebidas gaseosas o energizantes. Condimentos Salsa picante. Salsa barbacoa. Dulces/postres Chocolate y cacao. Rosquillas. Menta y mentol. Grasas y [employer] con alto contenido de grasas, incluidas las papas fritas. Otros Vinagre. Especias picantes, como la Br[employer], la pimienta blanca, la pimienta roja, la pimienta de cayena, el curry en [place], los clavos de [place], el jengibre y el [place] en polvo. Los artculos mencionados arriba pueden no ser Raytheon de las bebidas y los alimentos que se Theatre stage manager. Comunquese con el nutricionista para recibir ms informacin. Esta informacin no tiene Theme park manager el consejo del mdico. Asegrese de hacerle al mdico cualquier pregunta que tenga. Document Released: 12/21/2004 Document Revised: 04/03/2014 Document Reviewed: 01/15/2013 Elsevier Interactive Patient Education  2017 Arvin[employer]itor.   [place] (Fatigue) La fatiga es una sensacin de cansancio en todo momento, falta de energa o falta de motivacin. La fatiga ocasional o leve con frecuencia es una reaccin normal a la actividad o la vida en general. Sin embargo, la fatiga de Set designer duracin (crnica) o extrema puede indicar una enfermedad preexistente. INSTRUCCIONES PARA EL CUIDADO EN EL HOGAR Controle su fatiga para ver si hay cambios. Las siguientes indicaciones ayudarn a Psychologist, educational [employer] pueda sentir:  Hable con el mdico acerca de cunto debe dormir cada noche. Trate de dormir la cantidad de tiempo requerida todas las noches.  Tome los medicamentos solamente como se lo haya indicado el mdico.  Siga una dieta saludable y  nutritiva. Pida ayuda al mdico si necesita hacer cambios en su dieta.  Beba suficiente lquido para Photographermantener la  orina clara o de color amarillo plido.  Practique actividades que lo relajen, como yoga, meditacin, terapia de Edgertonmasajes o acupuntura.  Haga ejercicios regularmente.  Cambie las situaciones que le provocan estrs. Trate de que su Guamrutina de trabajo y personal sea moderada.  No consuma drogas.  Limite el consumo de alcohol a no ms de 1 medida por da si es mujer y no est Orthoptistembarazada, y 2 medidas si es hombre. Una medida equivale a 12onzas de cerveza, 5onzas de vino o 1onzas de bebidas alcohlicas de alta graduacin.  Tome una multivitamina, si se lo indic el mdico. SOLICITE ATENCIN MDICA SI:  La fatiga no mejora.  Tiene fiebre.  Tiene prdida o aumento involuntario de Grimespeso.  Tiene dolores de Turkmenistancabeza.  Tiene dificultad para: ? Dormirse. ? Dormir durante toda la noche.  Se siente enojado, culpable, ansioso o triste.  No puede defecar (estreimiento).  Tiene la piel seca.  Tiene hinchadas las piernas u otra parte del cuerpo. SOLICITE ATENCIN MDICA DE INMEDIATO SI:  Se siente confundido.  Tiene visin borrosa.  Sufre mareos o se desmaya.  Sufre un dolor intenso de Turkmenistancabeza.  Siente dolor intenso en el abdomen, la pelvis o la espalda.  Tiene dolor de pecho, dificultad para respirar, o latidos cardacos irregulares o acelerados.  No puede orinar u Omnicareorina menos de lo normal.  Presenta sangrado anormal, como sangrado del recto, la vagina, la nariz, los pulmones o los pezones.  Vomita sangre.  Tiene pensamientos acerca de hacerse dao a s mismo o cometer suicidio.  Le preocupa la posibilidad de hacerle dao a otra persona. Esta informacin no tiene Theme park managercomo fin reemplazar el consejo del mdico. Asegrese de hacerle al mdico cualquier pregunta que tenga. Document Released: 06/29/2008 Document Revised: 07/05/2015 Document Reviewed: 07/15/2013 Elsevier Interactive Patient Education  2018 Arvin[employer]itorElsevier Inc.  Dolor abdominal en adultos Abdominal Pain, Adult El dolor  abdominal puede tener muchas causas. A menudo, no es grave y Lithuaniamejora sin tratamiento o con tratamiento en la casa. Sin embargo, a Facilities managerveces el dolor abdominal es intenso. El mdico revisar sus antecedentes mdicos y le har un examen fsico para tratar de Production assistant, radiodeterminar la causa del dolor abdominal. Siga estas instrucciones en su casa:  Tome los medicamentos de venta libre y los recetados solamente como se lo haya indicado el mdico. No tome un laxante a menos que se lo haya indicado el mdico.  Beba suficiente lquido para Pharmacologistmantener la orina clara o de color amarillo plido.  Controle su afeccin para ver si hay cambios.  Concurra a todas las visitas de control como se lo haya indicado el mdico. Esto es importante. Comunquese con un mdico si:  El dolor abdominal cambia o empeora.  No tiene apetito o baja de peso sin proponrselo.  Est estreido o tiene diarrea durante ms de 2 o 3das.  Tiene dolor cuando orina o defeca.  El dolor abdominal lo despierta de noche.  El dolor empeora con las comidas, despus de comer o con determinados alimentos.  Tiene vmitos y no puede retener nada.  Tiene fiebre. Solicite ayuda de inmediato si:  El dolor no desaparece tan pronto como el mdico le dijo que era esperable.  No puede detener los vmitos.  El Engineer, miningdolor se siente solo en zonas del abdomen, como el lado derecho o la parte inferior izquierda del abdomen.  Las heces son sanguinolentas o de color  negro, o de aspecto alquitranado.  Tiene dolor intenso, clicos, o meteorismo en el abdomen.  Tiene signos de deshidratacin, por ejemplo: ? Larose [place], muy escasa o falta de [place]. ? Labios agrietados. ? M.D.C. Holdings. ? Ojos hundidos. ? Somnolencia. ? Debilidad. Esta informacin no tiene Theme park manager el consejo del mdico. Asegrese de hacerle al mdico cualquier pregunta que tenga. Document Released: 03/13/2005 Document Revised: 03/02/2016 Document Reviewed: 08/25/2015 Elsevier  Interactive Patient Education  2017 Elsevier Inc.  Dolor de cabeza general sin causa (General Headache Without Cause) El dolor de cabeza es un dolor o Dentist que se siente en la zona de la cabeza o del cuello. Puede no tener una causa especfica. Hay muchas causas y tipos de dolores de [place]. Los dolores de cabeza ms comunes son los siguientes:  Cefalea tensional.  Cefaleas migraosas.  Cefalea en brotes.  Cefaleas diarias crnicas. INSTRUCCIONES PARA EL CUIDADO EN EL HOGAR Controle su afeccin para ver si hay cambios. Siga estos pasos para Scientist, physiological afeccin: Control del Reynolds American medicamentos de venta libre y los recetados solamente como se lo haya indicado el mdico.  Cuando sienta dolor de cabeza acustese en un cuarto oscuro y tranquilo.  Si se lo indican, aplique hielo sobre la cabeza y la zona del cuello: ? Ponga el hielo en una bolsa plstica. ? Coloque una FirstEnergy Corp piel y la bolsa de hielo. ? Coloque el hielo durante , 2 a 3veces por da.  Utilice una almohadilla trmica o tome una ducha con agua caliente para aplicar calor en la cabeza y la zona del cuello como se lo haya indicado el mdico.  Mantenga las luces tenues si le Liz Claiborne luces brillantes o sus dolores de cabeza empeoran. Comida y bebida  Mantenga un horario para las comidas.  Limite el consumo de bebidas alcohlicas.  Consuma menos cantidad de cafena o deje de tomarla. Instrucciones generales  Concurra a todas las visitas de control como se lo haya indicado el mdico. Esto es importante.  Lleve un diario de los dolores de cabeza para Financial risk analyst qu factores pueden desencadenarlos. Por ejemplo, escriba los siguientes datos: ? Lo que usted come y bebe. ? Cunto tiempo duerme. ? Algn cambio en su dieta o en los medicamentos.  Pruebe algunas tcnicas de relajacin, como los [place].  Limite el estrs.  Sintese con la espalda recta y no tense los msculos.  No  consuma productos que contengan tabaco, incluidos cigarrillos, tabaco de Theatre manager o cigarrillos electrnicos. Si necesita ayuda para dejar de fumar, consulte al mdico.  Haga actividad fsica habitualmente como se lo haya indicado el mdico.  Tenga un horario fijo para dormir. Duerma entre 7 y 9horas o la cantidad de horas que le haya recomendado el mdico. SOLICITE ATENCIN MDICA SI:  Los medicamentos no Materials engineer los sntomas.  Tiene un dolor de cabeza que es diferente del dolor de cabeza habitual.  Tiene nuseas o vmitos.  Tiene fiebre.  SOLICITE ATENCIN MDICA DE INMEDIATO SI:  El dolor se hace cada vez ms intenso.  Ha vomitado repetidas veces.  Presenta rigidez en el cuello.  Sufre prdida de la visin.  Tiene problemas para hablar.  Siente dolor en el ojo o en el odo.  Presenta debilidad muscular o prdida del control muscular.  Pierde el equilibrio o tiene problemas para Advertising account planner.  Sufre mareos o se desmaya.  Se siente confundido.  Esta informacin no tiene Theme park manager el consejo del mdico. Asegrese de hacerle  al mdico cualquier pregunta que tenga. Document Released: 12/21/2004 Document Revised: 12/02/2014 Document Reviewed: 07/06/2014 Elsevier Interactive Patient Education  2017 ArvinMeritor.      IF you received an x-ray today, you will receive an invoice from [facility]. Please contact [facility] at [phone] with questions or concerns regarding your invoice.   IF you received labwork today, you will receive an invoice from [place]. Please contact [facility] at [phone] with questions or concerns regarding your invoice.   Our billing staff will not be able to assist you with questions regarding bills from these companies.  You will be contacted with the lab results as soon as they are available. The fastest way to get your results is to activate your My Chart account. Instructions are located on the last page  of this paperwork. If you have not heard from [place] regarding the results in 2 weeks, please contact this office.

## 2016-09-20 LAB — COMPREHENSIVE METABOLIC PANEL
ALT: 48 IU/L — ABNORMAL HIGH (ref 0–44)
AST: 24 IU/L (ref 0–40)
Albumin/Globulin Ratio: 1.9 (ref 1.2–2.2)
Albumin: 5 g/dL (ref 3.5–5.5)
Alkaline Phosphatase: 64 IU/L (ref 39–117)
BUN/Creatinine Ratio: 17 (ref 9–20)
BUN: 14 mg/dL (ref 6–24)
Bilirubin Total: 0.7 mg/dL (ref 0.0–1.2)
CO2: 23 mmol/L (ref 20–29)
Calcium: 10.1 mg/dL (ref 8.7–10.2)
Chloride: 100 mmol/L (ref 96–106)
Creatinine, Ser: 0.84 mg/dL (ref 0.76–1.27)
GFR calc Af Amer: 127 mL/min/{1.73_m2} (ref >59)
GFR calc non Af Amer: 110 mL/min/{1.73_m2} (ref >59)
Globulin, Total: 2.7 g/dL (ref 1.5–4.5)
Glucose: 100 mg/dL — ABNORMAL HIGH (ref 65–99)
Potassium: 4.1 mmol/L (ref 3.5–5.2)
Sodium: 142 mmol/L (ref 134–144)
Total Protein: 7.7 g/dL (ref 6.0–8.5)

## 2016-09-20 LAB — CBC
Hematocrit: 47.5 % (ref 37.5–51.0)
Hemoglobin: 16 g/dL (ref 13.0–17.7)
MCH: 30.4 pg (ref 26.6–33.0)
MCHC: 33.7 g/dL (ref 31.5–35.7)
MCV: 90 fL (ref 79–97)
Platelets: 247 10*3/uL (ref 150–379)
RBC: 5.26 x10E6/uL (ref 4.14–5.80)
RDW: 13 % (ref 12.3–15.4)
WBC: 6 10*3/uL (ref 3.4–10.8)

## 2016-09-20 LAB — LIPASE: Lipase: 32 U/L (ref 13–78)

## 2016-10-05 ENCOUNTER — Ambulatory Visit
Admission: RE | Admit: 2016-10-05 | Discharge: 2016-10-05 | Disposition: A | Discharge status: Home / Self Care | Payer: BLUE CROSS/BLUE SHIELD | Source: Ambulatory Visit | Attending: Family Medicine | Admitting: Family Medicine

## 2016-10-05 DIAGNOSIS — R1011 Right upper quadrant pain: Secondary | ICD-10-CM

## 2016-10-10 ENCOUNTER — Telehealth: Payer: Self-pay | Admitting: Family Medicine

## 2016-10-10 DIAGNOSIS — R1011 Right upper quadrant pain: Secondary | ICD-10-CM

## 2016-10-10 NOTE — Telephone Encounter (Signed)
Patient stated someone tried to call him but did not leave a message and there is nothing documented in his chart as to who tried to call him and why so can we please give him another call regarding whatever the reason is that we called him the first time.  His number is [phone] and he states if he does not answer please leave him a message with your name and why you are calling so he can call you back.

## 2016-10-10 NOTE — Telephone Encounter (Signed)
Pt is still having right side abdominal pain radiating to the middle area with intermit nausea.  Please place GI referral per patient  request .

## 2016-10-11 NOTE — Telephone Encounter (Signed)
Referral placed.

## 2016-10-17 ENCOUNTER — Encounter: Payer: Self-pay | Admitting: Radiology

## 2016-11-24 ENCOUNTER — Ambulatory Visit (INDEPENDENT_AMBULATORY_CARE_PROVIDER_SITE_OTHER): Payer: BLUE CROSS/BLUE SHIELD | Admitting: Family Medicine

## 2016-11-24 ENCOUNTER — Encounter: Payer: Self-pay | Admitting: Family Medicine

## 2016-11-24 VITALS — BP 130/80 | HR 74 | Temp 98.3°F | Resp 17 | Ht 66.5 in | Wt 163.0 lb

## 2016-11-24 DIAGNOSIS — F329 Major depressive disorder, single episode, unspecified: Secondary | ICD-10-CM

## 2016-11-24 DIAGNOSIS — R519 Headache, unspecified: Secondary | ICD-10-CM

## 2016-11-24 DIAGNOSIS — R1011 Right upper quadrant pain: Secondary | ICD-10-CM

## 2016-11-24 DIAGNOSIS — F32A Depression, unspecified: Secondary | ICD-10-CM

## 2016-11-24 DIAGNOSIS — R51 Headache: Secondary | ICD-10-CM | POA: Diagnosis not present

## 2016-11-24 MED ORDER — SERTRALINE HCL 50 MG PO TABS: 25.0000 mg | ORAL_TABLET | Freq: Every day | ORAL | 1 refills | Status: DC

## 2016-11-24 MED ORDER — OMEPRAZOLE 40 MG PO CPDR: 40.0000 mg | DELAYED_RELEASE_CAPSULE | Freq: Every day | ORAL | 1 refills | Status: DC

## 2016-11-24 NOTE — Patient Instructions (Addendum)
Continue half dose of Zoloft for now, as there is a risk of increased medication doses with taking Zoloft and imipramine together. If you ever stop imipramine, we can increase dose of Zoloft, but no changes for now.  I will check into gastroenterology referral for your abdominal pain.   Continue follow up with headache specialist.    IF you received an x-ray today, you will receive an invoice from [facility]. Please contact [facility] at [phone] with questions or concerns regarding your invoice.   IF you received labwork today, you will receive an invoice from [facility]. Please contact [facility] at [phone] with questions or concerns regarding your invoice.   Our billing staff will not be able to assist you with questions regarding bills from these companies.  You will be contacted with the lab results as soon as they are available. The fastest way to get your results is to activate your My Chart account. Instructions are located on the last page of this paperwork. If you have not heard from us regarding the results in 2 weeks, please contact this office.

## 2016-11-24 NOTE — Progress Notes (Signed)
Subjective:  By signing my name below, I, Anthony Kirby, attest that this documentation has been prepared under the direction and in the presence of Shade Flood, MD Electronically Signed: Charline Bills, ED Scribe 11/24/2016 at 10:29 AM.   Patient ID: Anthony Kirby, male    DOB: 1976-03-02, 41 y.o.   MRN: [id number]  Chief Complaint  Patient presents with  . Abdominal Pain   HPI [name] is a 41 y.o. male who presents to Primary Care at [facility] for follow-up of abdominal pain. Pt has been seen for gastritis/GERD. Elevated LFTS. Referred to GI. Last seen by me on 09/19/16. At that time he was still having pain in the RUQ, taking Zantac and omeprazole with minimal relief. He had a negative acute hep panel with normal LFTs in March. Borderline elevated AFT last visit. Normal CBC. Normal lipase. Pt had a RUQ [place] 10/05/16 with diffuse increase in liver echogenicity, suspected hepatic steatosis  Appears he was referred to [facility] GI 8/2.  Pt is still experiencing the same epigastric abdominal pain. Has been taking 40 mg omeprazole qd. Pt last saw gastroenterology ~3 years ago. States he has not received a call from GI yet.   Depression Pt has noticed improvement in depression when on Zoloft but does still have some depression symptoms. Would like to increase to full tablet instead of half, however, pt is taking Imipramine for HAs. He requests a refill at this visit as he is down to his last 3 tablets. Denies alcohol use, SI/HI.   Depression screen [facility] 2/9 11/24/2016 09/19/2016 07/19/2016 06/08/2016 02/02/2016  Decreased Interest 0 0 0 0 0  Down, Depressed, Hopeless 0 0 0 0 0  PHQ - 2 Score 0 0 0 0 0  Altered sleeping - - - - -  Tired, decreased energy - - - - -  Change in appetite - - - - -  Feeling bad or failure about yourself  - - - - -  Trouble concentrating - - - - -  Moving slowly or fidgety/restless - - - - -  Suicidal thoughts - - - - -  PHQ-9 Score - - - - -   H/o HAs Seen by Dr.  Neale Burly and started on Imipramine 50 mg qd which he states has improved HAs some. Had some injections done yesterday; follow-up in~2 weeks for more injections.   Patient Active Problem List   Diagnosis Date Noted  . GERD (gastroesophageal reflux disease) 01/17/2015  . Depression 01/17/2015   Past Medical History:  Diagnosis Date  . Depression   . Elevated LFTs   . Gastritis   . GERD (gastroesophageal reflux disease)    No past surgical history on file. Allergies  Allergen Reactions  . Penicillins Hives   Prior to Admission medications   Medication Sig Start Date End Date Taking? Authorizing Provider  omeprazole (PRILOSEC) 40 MG capsule Take 1 capsule (40 mg total) by mouth daily. 09/19/16   Shade Flood, MD  sertraline (ZOLOFT) 50 MG tablet Take 0.5 tablets (25 mg total) by mouth daily. 07/19/16   Tobey Grim, MD   Social History   Social History  . Marital status: Single    Spouse name: N/A  . Number of children: N/A  . Years of education: N/A   Occupational History  . Not on file.   Social History Main Topics  . Smoking status: Never Smoker  . Smokeless tobacco: Never Used  . Alcohol use No  . Drug use:  No  . Sexual activity: No   Other Topics Concern  . Not on file   Social History Narrative  . No narrative on file   Review of Systems  Gastrointestinal: Positive for abdominal pain.  Neurological: Positive for headaches.  Psychiatric/Behavioral: Positive for dysphoric mood. Negative for suicidal ideas.      Objective:   Physical Exam  Constitutional: He is oriented to person, place, and time. He appears well-developed and well-nourished.  HENT:  Head: Normocephalic and atraumatic.  Eyes: Pupils are equal, round, and reactive to light. EOM are normal.  Neck: No JVD present. Carotid bruit is not present.  Cardiovascular: Normal rate, regular rhythm and normal heart sounds.   No murmur heard. Pulmonary/Chest: Effort normal and breath sounds  normal. He has no rales.  Abdominal: Soft. Bowel sounds are normal. There is no tenderness.  Musculoskeletal: He exhibits no edema.  Neurological: He is alert and oriented to person, place, and time.  Skin: Skin is warm and dry.  Psychiatric: He has a normal mood and affect.  Vitals reviewed.  Vitals:   11/24/16 0953  BP: 130/80  Pulse: 74  Resp: 17  Temp: 98.3 F (36.8 C)  TempSrc: Oral  SpO2: 98%  Weight: 163 lb (73.9 kg)  Height: 5' 6.5" (1.689 m)   Assessment & Plan:   Anthony BaldingMorris Brick is a 41 y.o. male Nonintractable headache, unspecified chronicity pattern, unspecified headache type  - Now followed by headache specialist, continued on imipramine and apparently receiving injections. Discussed combined effect with imipramine and Zoloft, as below.   RUQ abdominal pain - Plan: omeprazole (PRILOSEC) 40 MG capsule  - Right upper quadrant/epigastric abdominal pain, long-standing issue. Has had borderline elevated LFTs, minimally elevated last visit on 1 LFT only. Ultrasound with suspected hepatic steatosis. Previous acute hep panel negative. He has persistent epigastric abdominal pain.   - Has not seen gastroenterology yet. We called [facility]  today and arranged appointment.   - Continue omeprazole for now.  Depression, unspecified depression type  - Overall stable. Had discussed potentially increase Zoloft dose, but with concurrent use of imipramine, increased risk of side effects/reaction. Continue 25 mg dose for now, then if he comes off of imipramine could consider higher dosing. Recheck 6 months  Meds ordered this encounter           . omeprazole (PRILOSEC) 40 MG capsule    Sig: Take 1 capsule (40 mg total) by mouth daily.    Dispense:  90 capsule    Refill:  1  . sertraline (ZOLOFT) 50 MG tablet    Sig: Take 0.5 tablets (25 mg total) by mouth daily.    Dispense:  45 tablet    Refill:  1   Patient Instructions   Continue half dose of Zoloft for now, as there is a risk  of increased medication doses with taking Zoloft and imipramine together. If you ever stop imipramine, we can increase dose of Zoloft, but no changes for now.  I will check into gastroenterology referral for your abdominal pain.   Continue follow up with headache specialist.    IF you received an x-ray today, you will receive an invoice from [facility]. Please contact [facility] at [phone] with questions or concerns regarding your invoice.   IF you received labwork today, you will receive an invoice from [facility]. Please contact [facility] at [phone] with questions or concerns regarding your invoice.   Our billing staff will not be able to assist you with  questions regarding bills from these companies.  You will be contacted with the lab results as soon as they are available. The fastest way to get your results is to activate your My Chart account. Instructions are located on the last page of this paperwork. If you have not heard from [place] regarding the results in 2 weeks, please contact this office.       I personally performed the services described in this documentation, which was scribed in my presence. The recorded information has been reviewed and considered for accuracy and completeness, addended by me as needed, and agree with information above.  Signed,   [name], MD Primary Care at [facility].  11/24/16 10:57 AM

## 2016-12-07 ENCOUNTER — Telehealth: Payer: Self-pay | Admitting: Family Medicine

## 2016-12-07 NOTE — Telephone Encounter (Signed)
[place] [facility] to let us know pt did not show up for new pt appointment with them. [facility] can be contacted at [phone].

## 2017-03-08 ENCOUNTER — Ambulatory Visit: Payer: BLUE CROSS/BLUE SHIELD | Admitting: Family Medicine

## 2017-03-08 ENCOUNTER — Other Ambulatory Visit: Payer: Self-pay

## 2017-03-08 ENCOUNTER — Encounter: Payer: Self-pay | Admitting: Family Medicine

## 2017-03-08 VITALS — BP 131/72 | HR 87 | Temp 98.2°F | Wt 163.0 lb

## 2017-03-08 DIAGNOSIS — F329 Major depressive disorder, single episode, unspecified: Secondary | ICD-10-CM | POA: Diagnosis not present

## 2017-03-08 DIAGNOSIS — K219 Gastro-esophageal reflux disease without esophagitis: Secondary | ICD-10-CM

## 2017-03-08 DIAGNOSIS — F32A Depression, unspecified: Secondary | ICD-10-CM

## 2017-03-08 LAB — POCT URINALYSIS DIP (MANUAL ENTRY)
Bilirubin, UA: NEGATIVE
Blood, UA: NEGATIVE
Glucose, UA: NEGATIVE mg/dL
Ketones, POC UA: NEGATIVE mg/dL
Leukocytes, UA: NEGATIVE
Nitrite, UA: NEGATIVE
Protein Ur, POC: NEGATIVE mg/dL
Spec Grav, UA: 1.015 (ref 1.010–1.025)
Urobilinogen, UA: 0.2 E.U./dL
pH, UA: 8.5 — AB (ref 5.0–8.0)

## 2017-03-08 MED ORDER — OMEPRAZOLE 40 MG PO CPDR: 40.0000 mg | DELAYED_RELEASE_CAPSULE | Freq: Every day | ORAL | 0 refills | Status: DC

## 2017-03-08 MED ORDER — SERTRALINE HCL 50 MG PO TABS: 25.0000 mg | ORAL_TABLET | Freq: Every day | ORAL | 0 refills | Status: DC

## 2017-03-08 NOTE — Progress Notes (Signed)
12/13/20184:47 PM  Anthony BaldingMorris Kirby June 11, 1975, 41 y.o. male [id number]  Chief Complaint  Patient presents with  . Abdominal Pain    has been having pain in abdomen for years, with headaches and weaknwess. Was given Rx that makes him feel fatigue    HPI:   Patient is a 41 y.o. male with past medical history significant for GERD and depression who presents today for follow-up.  PCP: Meredith StaggersJeffrey Greene, MD  He had to stop imipramine that was given for HA due to making him sleepy, however he was taking it during the morning. He also felt that this medication was helping his abd pain and depression some. He did not have any side effects from taking low dose TCA and SSRI.  He continues to struggle with GERD, has not called GI back to schedule an appointment. He is not following diet nor LFM.   [facility] 2[facility]9 03/08/2017 11/24/2016 09/19/2016  Decreased Interest 0 0 0  Down, Depressed, Hopeless 0 0 0  PHQ - 2 Score 0 0 0  Altered sleeping - - -  Tired, decreased energy - - -  Change in appetite - - -  Feeling bad or failure about yourself  - - -  Trouble concentrating - - -  Moving slowly or fidgety/restless - - -  Suicidal thoughts - - -  PHQ-9 Score - - -    Allergies  Allergen Reactions  . Penicillins Hives    Prior to Admission medications   Medication Sig Start Date End Date Taking? Authorizing Provider  omeprazole (PRILOSEC) 40 MG capsule Take 1 capsule (40 mg total) by mouth daily. 11/24/16  Yes Shade FloodGreene, Jeffrey R, MD  sertraline (ZOLOFT) 50 MG tablet Take 0.5 tablets (25 mg total) by mouth daily. 11/24/16  Yes Shade FloodGreene, Jeffrey R, MD  imipramine (TOFRANIL) 25 MG tablet Take 25 mg by mouth at bedtime.    [provider]    Past Medical History:  Diagnosis Date  . Depression   . Elevated LFTs   . Gastritis   . GERD (gastroesophageal reflux disease)     History reviewed. No pertinent surgical history.  Social History   Tobacco Use  . Smoking status:  Never Smoker  . Smokeless tobacco: Never Used  Substance Use Topics  . Alcohol use: No    Alcohol/week: 0.0 oz    Family History  Problem Relation Age of Onset  . Hypertension Mother     ROS Per hpi  OBJECTIVE:  Blood pressure 131/72, pulse 87, temperature 98.2 F (36.8 C), temperature source Oral, weight 163 lb (73.9 kg), SpO2 96 %.  Physical Exam  Constitutional: He is oriented to person, place, and time and well-developed, well-nourished, and in no distress.  HENT:  Head: Normocephalic and atraumatic.  Mouth/Throat: Oropharynx is clear and moist.  Eyes: EOM are normal. Pupils are equal, round, and reactive to light.  Neck: Neck supple.  Pulmonary/Chest: Effort normal.  Neurological: He is alert and oriented to person, place, and time. Gait normal.  Skin: Skin is warm and dry.  Psychiatric: Mood and affect normal.  Nursing note and vitals reviewed.   ASSESSMENT and PLAN 1. Gastroesophageal reflux disease, esophagitis presence not specified - more than 50% of this 25 minute visit was spent regarding counseling of dietary and lifestyle changes to help with GERD symptoms. Also provided patient with GI phone number and strongly encouraged him to schedule an appointment.  - omeprazole (PRILOSEC) 40 MG capsule; Take 1 capsule (40 mg  total) by mouth daily.  2. Depression, unspecified depression type Discussed continuing meds as prescribed, advised taking TCA at bedtime. Reviewed ssx of concern for possible TCA-SSRI interactions.   Other orders - sertraline (ZOLOFT) 50 MG tablet; Take 0.5 tablets (25 mg total) by mouth daily. - imipramine (TOFRANIL) 25 MG tablet; Take 1 tablet (25 mg total) by mouth at bedtime.  Return in about 3 months (around 06/06/2017).    Anthony LippsIrma M Santiago, MD [facility] [address] [place], [place] [address] Ph.  [phone] Fax [phone]

## 2017-03-08 NOTE — Patient Instructions (Addendum)
Anthony Kirby. [facility] [phone] 2. Evitar comidas que empeoran la gastritis, no comer pasar las 6-7pm, elevar la cabecera de la cama 3. Sertrline, 1/2 tableta cada otro dia x 2 semanas, despues 1/2 tableta dos veces a la semana x 2 semanas, y ya no tomar mas   Opciones de alimentos para pacientes con reflujo gastroesofgico - Adultos (Food Choices for Gastroesophageal Reflux Disease, Adult) Cuando se tiene reflujo gastroesofgico (ERGE), los alimentos que se ingieren y los hbitos de alimentacin son Engineer, productionmuy importantes. Elegir los alimentos adecuados puede ayudar a Altria Groupaliviar las molestias. QU PAUTAS DEBO SEGUIR?  Elija las frutas, los vegetales, los cereales integrales y los productos lcteos con bajo contenido de Lincolniagrasa.  Elija las carnes de Mapletownvaca, de pescado y de ave con bajo contenido de grasas.  Limite las grasas, [address]como los Cloverdaleaceites, los aderezos para Nikolskiensalada, la Vidormanteca, los frutos secos y Programme researcher, broadcasting/film/videoel aguacate.  Lleve un registro de alimentos. Esto ayuda a identificar los alimentos que ocasionan sntomas.  Evite los alimentos que le ocasionen sntomas. Pueden ser distintos para cada persona.  Haga comidas pequeas durante Glass blower/designerel da en lugar de 3 comidas abundantes.  Coma lentamente, en un lugar donde est distendido.  Limite el consumo de alimentos fritos.  Cocine los alimentos utilizando mtodos que no sean la fritura.  Evite el consumo alcohol.  Evite beber grandes cantidades de lquidos con las comidas.  Evite agacharse o recostarse hasta despus de 2 o 3horas de haber comido.  QU ALIMENTOS NO SE RECOMIENDAN? Estos son algunos alimentos y bebidas que pueden empeorar los sntomas: Veterinary surgeonVegetales Tomates. Jugo de tomate. Salsa de tomate y espagueti. Ajes. Cebolla y Roosevelt Estatesajo. Rbano picante. Frutas Naranjas, pomelos y limn (fruta y Sloveniajugo). Carnes Carnes de Nilesvaca, de pescado y de ave con gran contenido de grasas. Esto incluye los perros calientes, las Sheyennecostillas, el Fostoriajamn, la salchicha,  el salame y el tocino. Lcteos Leche entera y Shoal Creekleche chocolatada. PPG IndustriesCrema cida. Crema. Mantequilla. Helados. Queso crema. Bebidas T o caf. Bebidas gaseosas o bebidas energizantes. Condimentos Salsa picante. Salsa barbacoa. Dulces/postres Chocolate y cacao. Rosquillas. Menta y mentol. Grasas y Du Pontaceites Alimentos muy grasos. Esto incluye las papas fritas. Otros Vinagre. Especias picantes. Esto incluye la pimienta negra, la pimienta blanca, la pimienta roja, la pimienta de cayena, el curry en polvo, los clavos de Atlasolor, el jengibre y el Arubachile en polvo. Esta no es Raytheonuna lista completa de los alimentos y las bebidas que se Theatre stage managerdeben evitar. Comunquese con el nutricionista para recibir ms informacin. Esta informacin no tiene Theme park managercomo fin reemplazar el consejo del mdico. Asegrese de hacerle al mdico cualquier pregunta que tenga. Document Released: 09/12/2011 Document Revised: 04/03/2014 Document Reviewed: 01/15/2013 Elsevier Interactive Patient Education  2017 ArvinMeritorElsevier Inc.     IF you received an x-ray today, you will receive [facility] invoice from [facility]. Please contact [facility] at [phone] with questions or concerns regarding your invoice.   IF you received labwork today, you will receive an invoice from [facility]. Please contact [facility] at [phone] with questions or concerns regarding your invoice.   Our billing staff will not be able to assist you with questions regarding bills from these companies.  You will be contacted with the lab results as soon as they are available. The fastest way to get your results is to activate your My Chart account. Instructions are located on the last page of this paperwork. If you have not heard from us regarding the results in 2 weeks, please contact this office.

## 2017-03-12 ENCOUNTER — Encounter: Payer: Self-pay | Admitting: Family Medicine

## 2017-03-12 MED ORDER — IMIPRAMINE HCL 25 MG PO TABS: 25.0000 mg | ORAL_TABLET | Freq: Every day | ORAL | 2 refills | Status: DC

## 2017-12-08 IMAGING — US US ABDOMEN LIMITED
1 series · 14 of 25 positions shown · non-contrast
Comparison: None.

CLINICAL DATA: Right upper quadrant pain

EXAM:
ULTRASOUND ABDOMEN LIMITED RIGHT UPPER QUADRANT

[Series 1: us abdomen limited · 0.25mm/px · 14 of 44 slices shown]
[im 1/44]
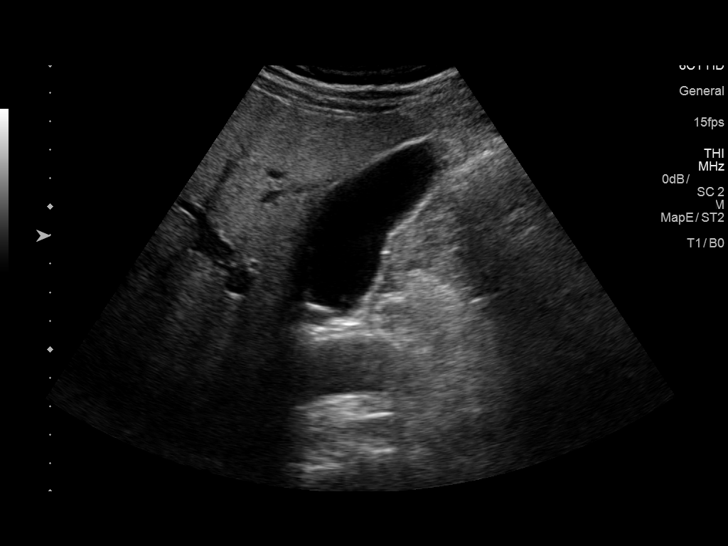
[im 4/44]
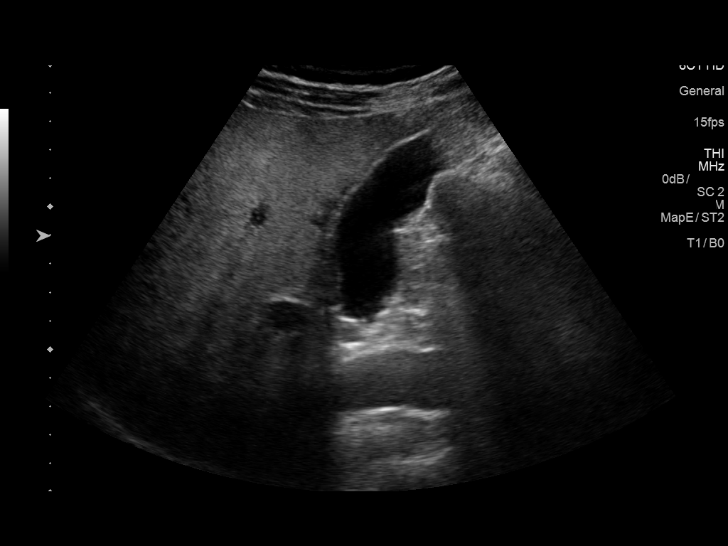
[im 8/44]
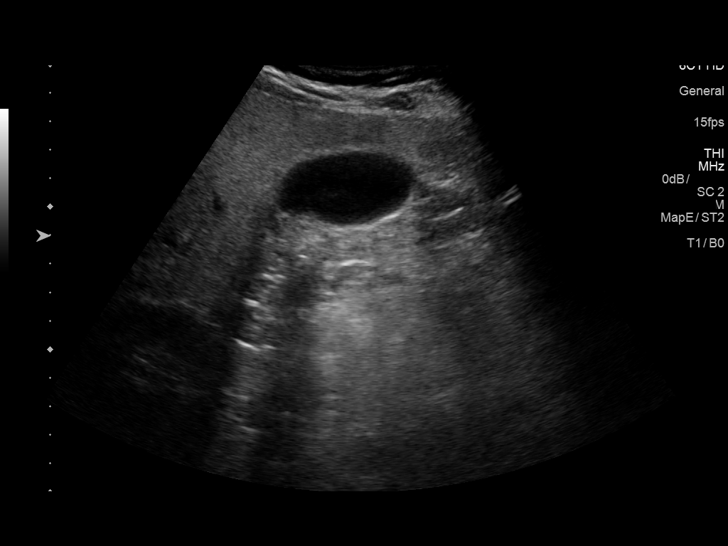
[im 11/44]
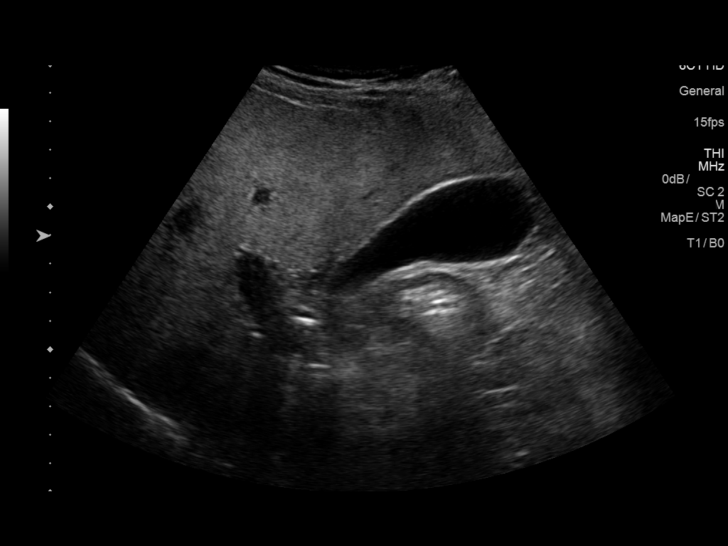
[im 15/44]
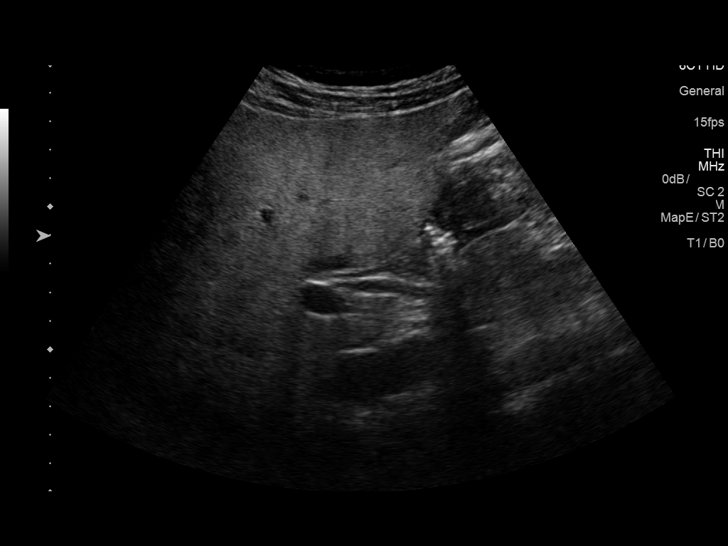
[im 17/44]
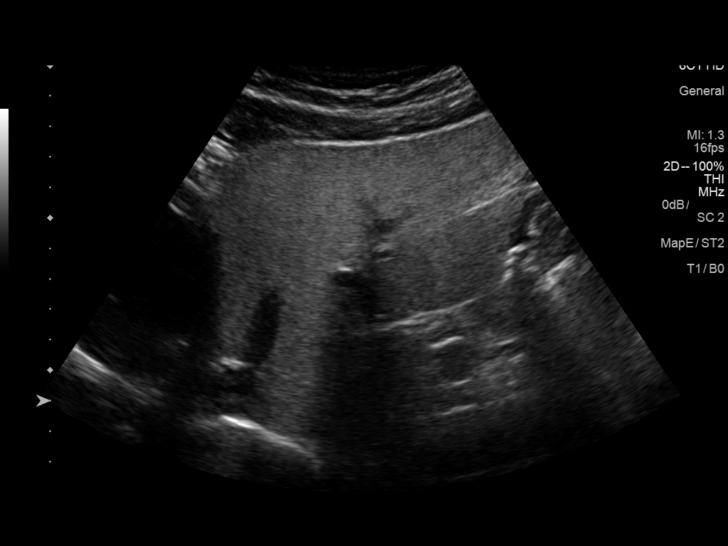
[im 20/44]
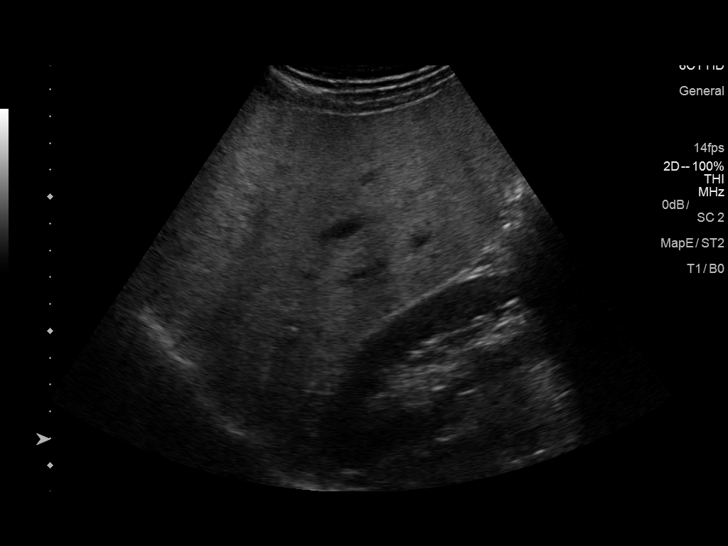
[im 24/44]
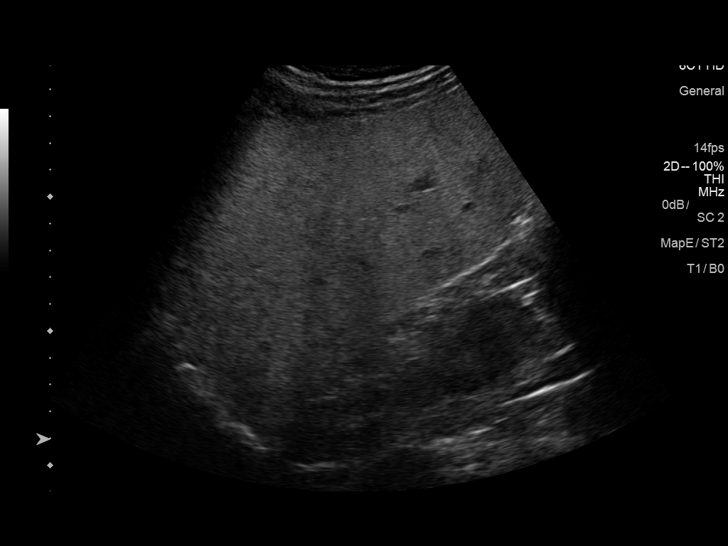
[im 27/44]
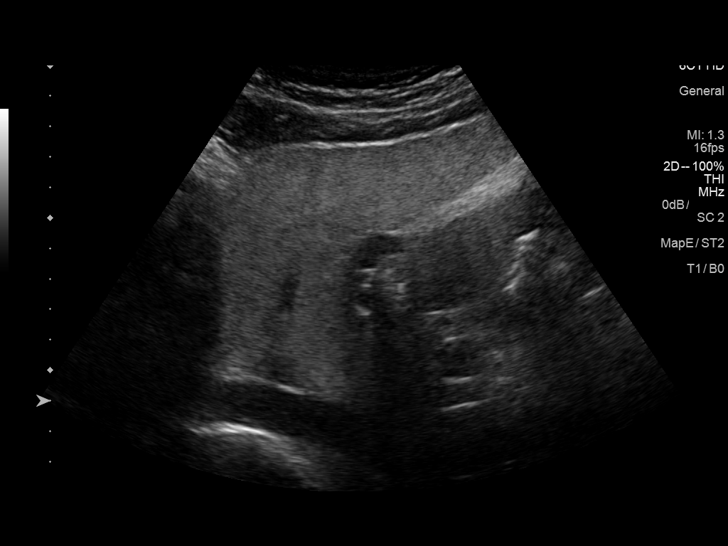
[im 29/44]
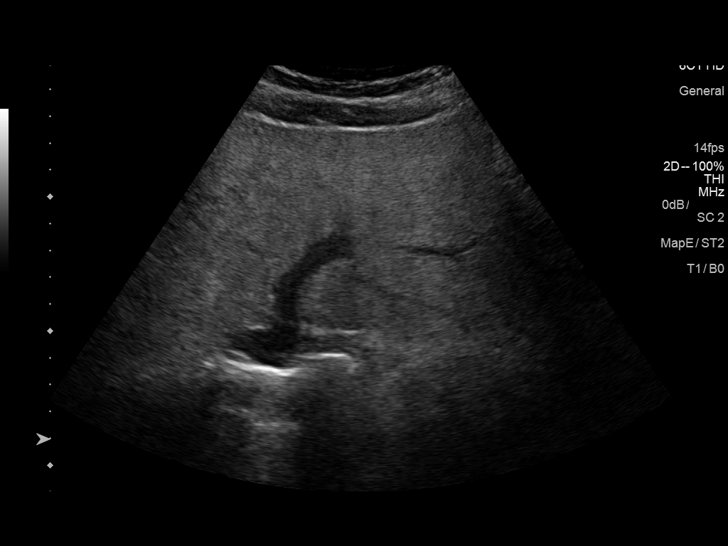
[im 33/44]
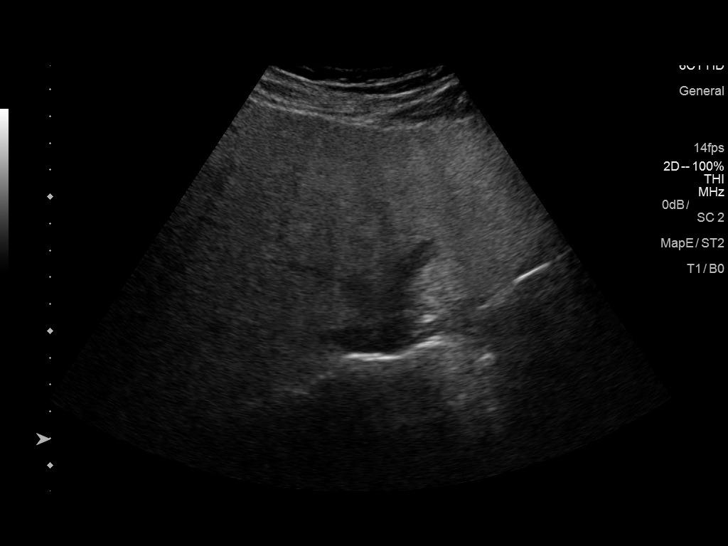
[im 36/44]
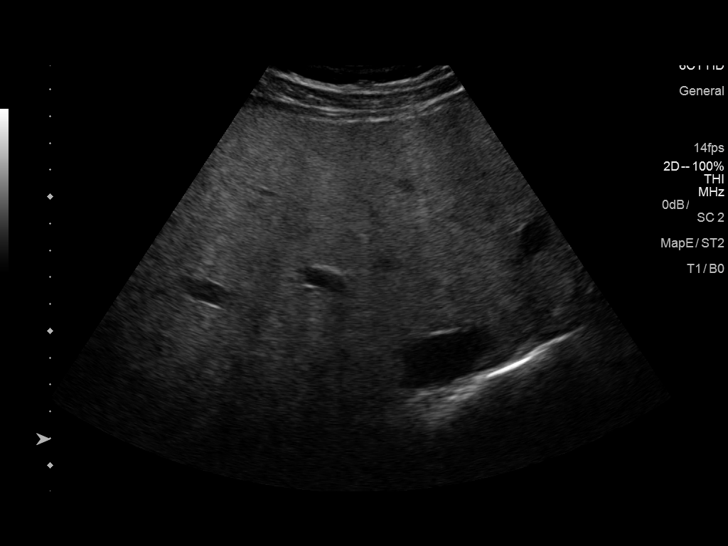
[im 40/44]
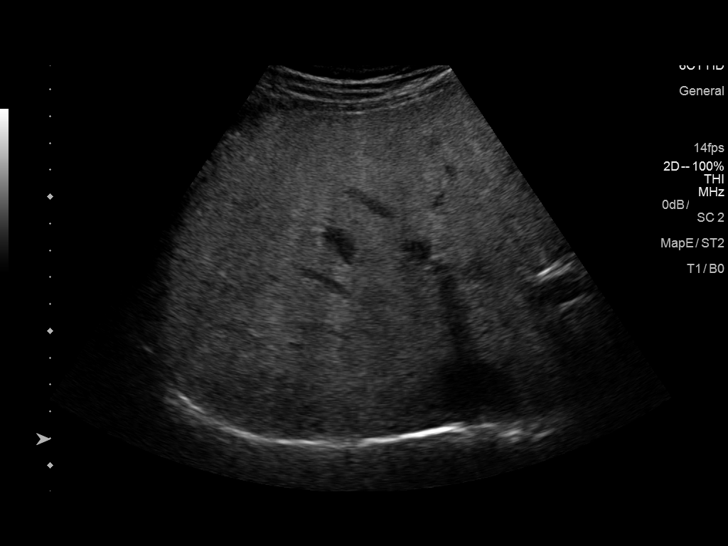
[im 44/44]
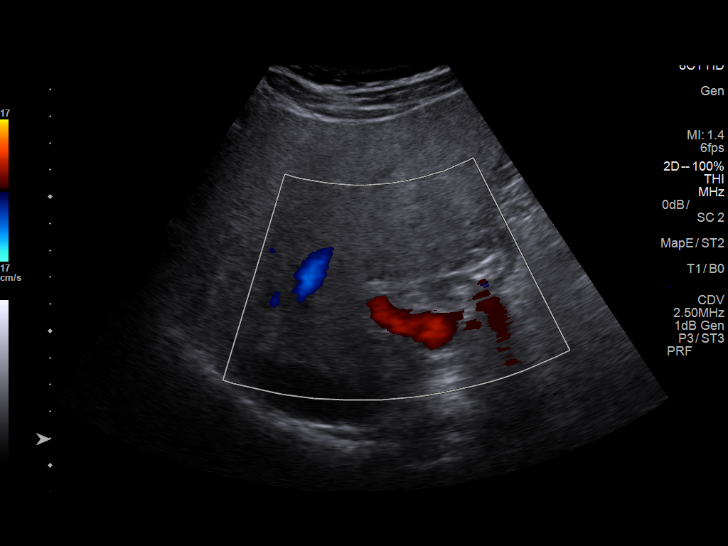

[14 of 25 positions shown; findings below may reference images not displayed]

FINDINGS: Gallbladder:

No gallstones or wall thickening visualized. There is no
pericholecystic fluid. No sonographic Murphy sign noted by
sonographer.

Common bile duct:

Diameter: 5 mm. No intrahepatic or extrahepatic biliary duct
dilatation.

Liver:

No focal lesion identified. Liver echogenicity is increased
diffusely.
IMPRESSION: Diffuse increase in liver echogenicity, a finding most likely
indicative of hepatic steatosis. While no focal liver lesions are
evident on this study, it is cautioned that the sensitivity of
ultrasound for detection of liver lesions is diminished in this
circumstance. Study otherwise unremarkable.

## 2019-06-10 ENCOUNTER — Other Ambulatory Visit: Payer: Self-pay

## 2019-06-10 ENCOUNTER — Encounter: Payer: Self-pay | Admitting: Registered Nurse

## 2019-06-10 ENCOUNTER — Ambulatory Visit: Payer: 59 | Admitting: Registered Nurse

## 2019-06-10 VITALS — BP 142/90 | HR 83 | Temp 97.4°F | Ht 66.0 in | Wt 167.2 lb

## 2019-06-10 DIAGNOSIS — K219 Gastro-esophageal reflux disease without esophagitis: Secondary | ICD-10-CM

## 2019-06-10 DIAGNOSIS — R1011 Right upper quadrant pain: Secondary | ICD-10-CM

## 2019-06-10 MED ORDER — ESOMEPRAZOLE MAGNESIUM 40 MG PO CPDR: 40.0000 mg | DELAYED_RELEASE_CAPSULE | Freq: Every day | ORAL | 1 refills | Status: DC

## 2019-06-10 NOTE — Patient Instructions (Signed)
° ° ° °  If you have lab work done today you will be contacted with your lab results within the next 2 weeks.  If you have not heard from us then please contact us. The fastest way to get your results is to register for My Chart. ° ° °IF you received an x-ray today, you will receive an invoice from [facility]. Please contact [facility] at [phone] with questions or concerns regarding your invoice.  ° °IF you received labwork today, you will receive an invoice from [facility]. Please contact [facility] at [phone] with questions or concerns regarding your invoice.  ° °Our billing staff will not be able to assist you with questions regarding bills from these companies. ° °You will be contacted with the lab results as soon as they are available. The fastest way to get your results is to activate your My Chart account. Instructions are located on the last page of this paperwork. If you have not heard from us regarding the results in 2 weeks, please contact this office. °  ° ° ° °

## 2019-06-11 LAB — CBC WITH DIFFERENTIAL/PLATELET
Basophils Absolute: 0 10*3/uL (ref 0.0–0.2)
Basos: 1 %
EOS (ABSOLUTE): 0.2 10*3/uL (ref 0.0–0.4)
Eos: 3 %
Hematocrit: 50.5 % (ref 37.5–51.0)
Hemoglobin: 17.4 g/dL (ref 13.0–17.7)
Immature Grans (Abs): 0.1 10*3/uL (ref 0.0–0.1)
Immature Granulocytes: 1 %
Lymphocytes Absolute: 2.5 10*3/uL (ref 0.7–3.1)
Lymphs: 37 %
MCH: 31.2 pg (ref 26.6–33.0)
MCHC: 34.5 g/dL (ref 31.5–35.7)
MCV: 91 fL (ref 79–97)
Monocytes Absolute: 0.5 10*3/uL (ref 0.1–0.9)
Monocytes: 7 %
Neutrophils Absolute: 3.5 10*3/uL (ref 1.4–7.0)
Neutrophils: 51 %
Platelets: 321 10*3/uL (ref 150–450)
RBC: 5.58 x10E6/uL (ref 4.14–5.80)
RDW: 12 % (ref 11.6–15.4)
WBC: 6.7 10*3/uL (ref 3.4–10.8)

## 2019-06-11 LAB — COMPREHENSIVE METABOLIC PANEL
ALT: 34 IU/L (ref 0–44)
AST: 20 IU/L (ref 0–40)
Albumin/Globulin Ratio: 1.7 (ref 1.2–2.2)
Albumin: 4.8 g/dL (ref 4.0–5.0)
Alkaline Phosphatase: 76 IU/L (ref 39–117)
BUN/Creatinine Ratio: 15 (ref 9–20)
BUN: 11 mg/dL (ref 6–24)
Bilirubin Total: 0.2 mg/dL (ref 0.0–1.2)
CO2: 25 mmol/L (ref 20–29)
Calcium: 10.3 mg/dL — ABNORMAL HIGH (ref 8.7–10.2)
Chloride: 103 mmol/L (ref 96–106)
Creatinine, Ser: 0.74 mg/dL — ABNORMAL LOW (ref 0.76–1.27)
GFR calc Af Amer: 131 mL/min/{1.73_m2} (ref >59)
GFR calc non Af Amer: 113 mL/min/{1.73_m2} (ref >59)
Globulin, Total: 2.9 g/dL (ref 1.5–4.5)
Glucose: 106 mg/dL — ABNORMAL HIGH (ref 65–99)
Potassium: 4.2 mmol/L (ref 3.5–5.2)
Sodium: 142 mmol/L (ref 134–144)
Total Protein: 7.7 g/dL (ref 6.0–8.5)

## 2019-06-11 LAB — AMYLASE: Amylase: 93 U/L (ref 31–110)

## 2019-06-11 LAB — LIPASE: Lipase: 39 U/L (ref 13–78)

## 2019-06-13 NOTE — Progress Notes (Signed)
Good evening!  Normal results letter would be fantastic if you don't mind.  Thank you! Have a stellar weekend!  Jari Sportsman, NP

## 2019-06-16 ENCOUNTER — Encounter: Payer: Self-pay | Admitting: Radiology

## 2019-06-19 ENCOUNTER — Encounter: Payer: Self-pay | Admitting: Registered Nurse

## 2019-06-19 NOTE — Progress Notes (Signed)
Established Patient Office Visit  Subjective:  Patient ID: Anthony Kirby, male    DOB: February 04, 1976  Age: 44 y.o. MRN: [id number]  CC:  Chief Complaint  Patient presents with  . GI Problem    patient has been having some pain in upper right part of stomach for about a month. per patient he has been using pantoprazole but only have one left. also been having a bitter taste in his mouth in the morning with a headache   . Medication Refill    pantoprazole    HPI Anthony Kirby presents for epigastric/urq pain ongoing for around 1 mo - seems to be worsening. Usually takes pantoprazole but has been taking less because he is running out. No blood in stool or melena, denies nvd. No fevers, chills, shob, doe, chest pain.  Pain is worse when lying down at night.  No hx of gallbladder dysfunction.  No further concerns  Past Medical History:  Diagnosis Date  . Depression   . Elevated LFTs   . Gastritis   . GERD (gastroesophageal reflux disease)     No past surgical history on file.  Family History  Problem Relation Age of Onset  . Hypertension Mother     Social History   Socioeconomic History  . Marital status: Single    Spouse name: Not on file  . Number of children: Not on file  . Years of education: Not on file  . Highest education level: Not on file  Occupational History  . Not on file  Tobacco Use  . Smoking status: Never Smoker  . Smokeless tobacco: Never Used  Substance and Sexual Activity  . Alcohol use: No    Alcohol/week: 0.0 standard drinks  . Drug use: No  . Sexual activity: Never  Other Topics Concern  . Not on file  Social History Narrative  . Not on file   Social Determinants of Health   Financial Resource Strain:   . Difficulty of Paying Living Expenses:   Food Insecurity:   . Worried About Programme researcher, broadcasting/film/video in the Last Year:   . Barista in the Last Year:   Transportation Needs:   . Freight forwarder (Medical):   Marland Kitchen Lack of  Transportation (Non-Medical):   Physical Activity:   . Days of Exercise per Week:   . Minutes of Exercise per Session:   Stress:   . Feeling of Stress :   Social Connections:   . Frequency of Communication with Friends and Family:   . Frequency of Social Gatherings with Friends and Family:   . Attends Religious Services:   . Active Member of Clubs or Organizations:   . Attends Banker Meetings:   Marland Kitchen Marital Status:   Intimate Partner Violence:   . Fear of Current or Ex-Partner:   . Emotionally Abused:   Marland Kitchen Physically Abused:   . Sexually Abused:     Outpatient Medications Prior to Visit  Medication Sig Dispense Refill  . imipramine (TOFRANIL) 25 MG tablet Take 1 tablet (25 mg total) by mouth at bedtime. 30 tablet 2  . omeprazole (PRILOSEC) 40 MG capsule Take 1 capsule (40 mg total) by mouth daily. 90 capsule 0  . pantoprazole (PROTONIX) 40 MG tablet Take 40 mg by mouth daily.    . sertraline (ZOLOFT) 50 MG tablet Take 0.5 tablets (25 mg total) by mouth daily. 45 tablet 0   No facility-administered medications prior to visit.    Allergies  Allergen Reactions  . Penicillins Hives    ROS Review of Systems  Constitutional: Negative.   HENT: Negative.   Eyes: Negative.   Respiratory: Negative.   Cardiovascular: Negative.   Gastrointestinal: Positive for abdominal pain. Negative for abdominal distention, anal bleeding, blood in stool, constipation, diarrhea, nausea, rectal pain and vomiting.  Endocrine: Negative.   Genitourinary: Negative.   Musculoskeletal: Negative.   Skin: Negative.   Allergic/Immunologic: Negative.   Neurological: Negative.   Hematological: Negative.   Psychiatric/Behavioral: Negative.   All other systems reviewed and are negative.     Objective:    Physical Exam  Constitutional: He is oriented to person, place, and time. He appears well-developed and well-nourished. No distress.  Cardiovascular: Normal rate and regular rhythm.    Pulmonary/Chest: Effort normal. No respiratory distress.  Abdominal: Soft. Bowel sounds are normal. He exhibits no distension and no mass. There is abdominal tenderness (epigastric). There is no rebound and no guarding.  Neurological: He is alert and oriented to person, place, and time.  Skin: Skin is warm and dry. No rash noted. He is not diaphoretic. No erythema. No pallor.  Psychiatric: He has a normal mood and affect. His behavior is normal. Judgment and thought content normal.  Nursing note and vitals reviewed.   BP (!) 142/90   Pulse 83   Temp (!) 97.4 F (36.3 C) (Temporal)   Ht 5\' 6"  (1.676 m)   Wt 167 lb 3.2 oz (75.8 kg)   SpO2 99%   BMI 26.99 kg/m  Wt Readings from Last 3 Encounters:  06/10/19 167 lb 3.2 oz (75.8 kg)  03/08/17 163 lb (73.9 kg)  11/24/16 163 lb (73.9 kg)     Health Maintenance Due  Topic Date Due  . HIV Screening  Never done    There are no preventive care reminders to display for this patient.  No results found for: TSH Lab Results  Component Value Date   WBC 6.7 06/10/2019   HGB 17.4 06/10/2019   HCT 50.5 06/10/2019   MCV 91 06/10/2019   PLT 321 06/10/2019   Lab Results  Component Value Date   NA 142 06/10/2019   K 4.2 06/10/2019   CO2 25 06/10/2019   GLUCOSE 106 (H) 06/10/2019   BUN 11 06/10/2019   CREATININE 0.74 (L) 06/10/2019   BILITOT 0.2 06/10/2019   ALKPHOS 76 06/10/2019   AST 20 06/10/2019   ALT 34 06/10/2019   PROT 7.7 06/10/2019   ALBUMIN 4.8 06/10/2019   CALCIUM 10.3 (H) 06/10/2019   No results found for: CHOL No results found for: HDL No results found for: LDLCALC No results found for: TRIG No results found for: CHOLHDL Lab Results  Component Value Date   HGBA1C 5.4 10/23/2015      Assessment & Plan:   Problem List Items Addressed This Visit      Digestive   GERD (gastroesophageal reflux disease)   Relevant Medications   pantoprazole (PROTONIX) 40 MG tablet   esomeprazole (NEXIUM) 40 MG capsule     Other Visit Diagnoses    Abdominal pain, right upper quadrant    -  Primary   Relevant Orders   Comprehensive metabolic panel (Completed)   CBC with Differential (Completed)   Amylase (Completed)   Lipase (Completed)      Meds ordered this encounter  Medications  . esomeprazole (NEXIUM) 40 MG capsule    Sig: Take 1 capsule (40 mg total) by mouth daily.    Dispense:  90 capsule  Refill:  1    Order Specific Question:   Supervising Provider    Answer:   Doristine Bosworth [id number]    Follow-up: No follow-ups on file.   PLAN  Likely GERD  Refill pantoprazole at a dose of 40mg  PO qd   Labs collected to help r/o gi bleed, cholecystitis, liver dysfunction, etc  Patient encouraged to call clinic with any questions, comments, or concerns.  , NP

## 2019-06-26 ENCOUNTER — Telehealth: Payer: Self-pay | Admitting: General Practice

## 2019-06-26 NOTE — Telephone Encounter (Signed)
Pt called is requesting a spanish speaking nurse to give him a call to give him his results on the labs he had done on 3/16. Please advise. [phone]

## 2019-08-29 ENCOUNTER — Other Ambulatory Visit: Payer: Self-pay

## 2019-08-29 ENCOUNTER — Ambulatory Visit: Payer: 59 | Admitting: Registered Nurse

## 2019-08-29 ENCOUNTER — Encounter: Payer: Self-pay | Admitting: Registered Nurse

## 2019-08-29 VITALS — BP 115/68 | HR 90 | Temp 97.9°F | Resp 17 | Ht 66.0 in | Wt 165.0 lb

## 2019-08-29 DIAGNOSIS — K219 Gastro-esophageal reflux disease without esophagitis: Secondary | ICD-10-CM

## 2019-08-29 NOTE — Patient Instructions (Signed)
° ° ° °  If you have lab work done today you will be contacted with your lab results within the next 2 weeks.  If you have not heard from us then please contact us. The fastest way to get your results is to register for My Chart. ° ° °IF you received an x-ray today, you will receive an invoice from [facility]. Please contact [facility] at [phone] with questions or concerns regarding your invoice.  ° °IF you received labwork today, you will receive an invoice from [facility]. Please contact [facility] at [phone] with questions or concerns regarding your invoice.  ° °Our billing staff will not be able to assist you with questions regarding bills from these companies. ° °You will be contacted with the lab results as soon as they are available. The fastest way to get your results is to activate your My Chart account. Instructions are located on the last page of this paperwork. If you have not heard from us regarding the results in 2 weeks, please contact this office. °  ° ° ° °

## 2019-09-05 ENCOUNTER — Other Ambulatory Visit: Payer: Self-pay

## 2019-09-05 ENCOUNTER — Other Ambulatory Visit: Payer: Self-pay | Admitting: Registered Nurse

## 2019-09-05 ENCOUNTER — Ambulatory Visit (INDEPENDENT_AMBULATORY_CARE_PROVIDER_SITE_OTHER): Payer: 59 | Admitting: Family Medicine

## 2019-09-05 DIAGNOSIS — K5900 Constipation, unspecified: Secondary | ICD-10-CM

## 2019-09-05 DIAGNOSIS — R1084 Generalized abdominal pain: Secondary | ICD-10-CM

## 2019-09-06 LAB — CBC WITH DIFFERENTIAL/PLATELET
Basophils Absolute: 0 10*3/uL (ref 0.0–0.2)
Basos: 1 %
EOS (ABSOLUTE): 0.2 10*3/uL (ref 0.0–0.4)
Eos: 3 %
Hematocrit: 47.6 % (ref 37.5–51.0)
Hemoglobin: 16.5 g/dL (ref 13.0–17.7)
Immature Grans (Abs): 0 10*3/uL (ref 0.0–0.1)
Immature Granulocytes: 0 %
Lymphocytes Absolute: 2.3 10*3/uL (ref 0.7–3.1)
Lymphs: 42 %
MCH: 31.5 pg (ref 26.6–33.0)
MCHC: 34.7 g/dL (ref 31.5–35.7)
MCV: 91 fL (ref 79–97)
Monocytes Absolute: 0.4 10*3/uL (ref 0.1–0.9)
Monocytes: 7 %
Neutrophils Absolute: 2.7 10*3/uL (ref 1.4–7.0)
Neutrophils: 47 %
Platelets: 239 10*3/uL (ref 150–450)
RBC: 5.23 x10E6/uL (ref 4.14–5.80)
RDW: 12 % (ref 11.6–15.4)
WBC: 5.6 10*3/uL (ref 3.4–10.8)

## 2019-09-06 LAB — HEMOGLOBIN A1C
Est. average glucose Bld gHb Est-mCnc: 108 mg/dL
Hgb A1c MFr Bld: 5.4 % (ref 4.8–5.6)

## 2019-09-06 LAB — COMPREHENSIVE METABOLIC PANEL
ALT: 56 IU/L — ABNORMAL HIGH (ref 0–44)
AST: 26 IU/L (ref 0–40)
Albumin/Globulin Ratio: 2.4 — ABNORMAL HIGH (ref 1.2–2.2)
Albumin: 5.1 g/dL — ABNORMAL HIGH (ref 4.0–5.0)
Alkaline Phosphatase: 73 IU/L (ref 48–121)
BUN/Creatinine Ratio: 25 — ABNORMAL HIGH (ref 9–20)
BUN: 19 mg/dL (ref 6–24)
Bilirubin Total: 0.4 mg/dL (ref 0.0–1.2)
CO2: 24 mmol/L (ref 20–29)
Calcium: 9.8 mg/dL (ref 8.7–10.2)
Chloride: 102 mmol/L (ref 96–106)
Creatinine, Ser: 0.75 mg/dL — ABNORMAL LOW (ref 0.76–1.27)
GFR calc Af Amer: 130 mL/min/{1.73_m2} (ref >59)
GFR calc non Af Amer: 112 mL/min/{1.73_m2} (ref >59)
Globulin, Total: 2.1 g/dL (ref 1.5–4.5)
Glucose: 96 mg/dL (ref 65–99)
Potassium: 4.4 mmol/L (ref 3.5–5.2)
Sodium: 140 mmol/L (ref 134–144)
Total Protein: 7.2 g/dL (ref 6.0–8.5)

## 2019-09-06 LAB — AMYLASE: Amylase: 91 U/L (ref 31–110)

## 2019-09-06 LAB — LIPASE: Lipase: 39 U/L (ref 13–78)

## 2019-12-02 ENCOUNTER — Encounter: Payer: 59 | Admitting: Registered Nurse

## 2019-12-03 ENCOUNTER — Encounter: Payer: Self-pay | Admitting: Registered Nurse

## 2019-12-10 ENCOUNTER — Telehealth: Payer: Self-pay | Admitting: General Practice

## 2019-12-10 NOTE — Telephone Encounter (Signed)
Pt No Show his CPE appt on 12/02/19. Pt stated he will call our office back when he checked his schedule. Please advise.

## 2019-12-14 ENCOUNTER — Encounter: Payer: Self-pay | Admitting: Registered Nurse

## 2019-12-14 NOTE — Progress Notes (Signed)
Acute Office Visit  Subjective:    Patient ID: Anthony Kirby, male    DOB: 01/08/1976, 44 y.o.   MRN: [id number]  Chief Complaint  Patient presents with  . GI Problem    patient states he is here because he is still having some stomach and abdominal pain ans doesnt think the medication prescribed is working. it feels like something is in his chest when he take a deep breath. patient would like to medication    HPI Patient is in today for ongoing GI pain  Admits poor med compliance with existing plan, no dietary changes Still having epigastric discomfort radiating up towards chest. Burning pain Feels bloated often. No stool changes Hx of GERD and gastritis No nvd, shob, doe, chest pain, headache, visual changes  Past Medical History:  Diagnosis Date  . Depression   . Elevated LFTs   . Gastritis   . GERD (gastroesophageal reflux disease)     No past surgical history on file.  Family History  Problem Relation Age of Onset  . Hypertension Mother     Social History   Socioeconomic History  . Marital status: Single    Spouse name: Not on file  . Number of children: Not on file  . Years of education: Not on file  . Highest education level: Not on file  Occupational History  . Not on file  Tobacco Use  . Smoking status: Never Smoker  . Smokeless tobacco: Never Used  Substance and Sexual Activity  . Alcohol use: No    Alcohol/week: 0.0 standard drinks  . Drug use: No  . Sexual activity: Never  Other Topics Concern  . Not on file  Social History Narrative  . Not on file   Social Determinants of Health   Financial Resource Strain:   . Difficulty of Paying Living Expenses: Not on file  Food Insecurity:   . Worried About Programme researcher, broadcasting/film/video in the Last Year: Not on file  . Ran Out of Food in the Last Year: Not on file  Transportation Needs:   . Lack of Transportation (Medical): Not on file  . Lack of Transportation (Non-Medical): Not on file  Physical Activity:    . Days of Exercise per Week: Not on file  . Minutes of Exercise per Session: Not on file  Stress:   . Feeling of Stress : Not on file  Social Connections:   . Frequency of Communication with Friends and Family: Not on file  . Frequency of Social Gatherings with Friends and Family: Not on file  . Attends Religious Services: Not on file  . Active Member of Clubs or Organizations: Not on file  . Attends Banker Meetings: Not on file  . Marital Status: Not on file  Intimate Partner Violence:   . Fear of Current or Ex-Partner: Not on file  . Emotionally Abused: Not on file  . Physically Abused: Not on file  . Sexually Abused: Not on file    Outpatient Medications Prior to Visit  Medication Sig Dispense Refill  . esomeprazole (NEXIUM) 40 MG capsule Take 1 capsule (40 mg total) by mouth daily. 90 capsule 1  . imipramine (TOFRANIL) 25 MG tablet Take 1 tablet (25 mg total) by mouth at bedtime. 30 tablet 2  . omeprazole (PRILOSEC) 40 MG capsule Take 1 capsule (40 mg total) by mouth daily. 90 capsule 0  . pantoprazole (PROTONIX) 40 MG tablet Take 40 mg by mouth daily.    Marland Kitchen  sertraline (ZOLOFT) 50 MG tablet Take 0.5 tablets (25 mg total) by mouth daily. 45 tablet 0   No facility-administered medications prior to visit.    Allergies  Allergen Reactions  . Penicillins Hives    Review of Systems  Constitutional: Negative.   HENT: Negative.   Eyes: Negative.   Respiratory: Negative.   Cardiovascular: Negative.   Gastrointestinal: Positive for abdominal pain. Negative for abdominal distention, anal bleeding, blood in stool, constipation, diarrhea, nausea, rectal pain and vomiting.  Endocrine: Negative.   Genitourinary: Negative.   Musculoskeletal: Negative.   Skin: Negative.   Allergic/Immunologic: Negative.   Neurological: Negative.   Hematological: Negative.   Psychiatric/Behavioral: Negative.        Objective:    Physical Exam Vitals and nursing note reviewed.    Constitutional:      Appearance: Normal appearance. He is normal weight.  HENT:     Head: Normocephalic and atraumatic.  Cardiovascular:     Rate and Rhythm: Normal rate and regular rhythm.     Pulses: Normal pulses.     Heart sounds: Normal heart sounds. No murmur heard.  No friction rub. No gallop.   Pulmonary:     Effort: Pulmonary effort is normal. No respiratory distress.     Breath sounds: Normal breath sounds. No stridor. No wheezing, rhonchi or rales.  Chest:     Chest wall: No tenderness.  Abdominal:     General: Abdomen is flat. Bowel sounds are normal. There is no distension.     Palpations: Abdomen is soft. There is no mass.     Tenderness: There is abdominal tenderness (epigastric and LUQ. mild). There is no right CVA tenderness, left CVA tenderness, guarding or rebound.     Hernia: No hernia is present.  Skin:    Capillary Refill: Capillary refill takes less than 2 seconds.  Neurological:     General: No focal deficit present.     Mental Status: He is alert and oriented to person, place, and time. Mental status is at baseline.  Psychiatric:        Mood and Affect: Mood normal.        Behavior: Behavior normal.        Thought Content: Thought content normal.        Judgment: Judgment normal.     BP 115/68   Pulse 90   Temp 97.9 F (36.6 C) (Temporal)   Resp 17   Ht 5\' 6"  (1.676 m)   Wt 165 lb (74.8 kg)   SpO2 90%   BMI 26.63 kg/m  Wt Readings from Last 3 Encounters:  08/29/19 165 lb (74.8 kg)  06/10/19 167 lb 3.2 oz (75.8 kg)  03/08/17 163 lb (73.9 kg)    Health Maintenance Due  Topic Date Due  . INFLUENZA VACCINE  10/26/2019    There are no preventive care reminders to display for this patient.   No results found for: TSH Lab Results  Component Value Date   WBC 5.6 09/05/2019   HGB 16.5 09/05/2019   HCT 47.6 09/05/2019   MCV 91 09/05/2019   PLT 239 09/05/2019   Lab Results  Component Value Date   NA 140 09/05/2019   K 4.4 09/05/2019    CO2 24 09/05/2019   GLUCOSE 96 09/05/2019   BUN 19 09/05/2019   CREATININE 0.75 (L) 09/05/2019   BILITOT 0.4 09/05/2019   ALKPHOS 73 09/05/2019   AST 26 09/05/2019   ALT 56 (H) 09/05/2019   PROT 7.2  09/05/2019   ALBUMIN 5.1 (H) 09/05/2019   CALCIUM 9.8 09/05/2019   No results found for: CHOL No results found for: HDL No results found for: LDLCALC No results found for: TRIG No results found for: CHOLHDL Lab Results  Component Value Date   HGBA1C 5.4 09/05/2019       Assessment & Plan:   Problem List Items Addressed This Visit      Digestive   GERD (gastroesophageal reflux disease) - Primary       No orders of the defined types were placed in this encounter.  PLAN  In depth discussion about GERD triggers and avoiding these foods and habits  Med compliance encouraged for at least two weeks for effect  Return prn  Patient encouraged to call clinic with any questions, comments, or concerns.  Janeece Agee, NP

## 2019-12-18 ENCOUNTER — Encounter: Payer: Self-pay | Admitting: Nurse Practitioner

## 2020-01-21 ENCOUNTER — Encounter: Payer: Self-pay | Admitting: Nurse Practitioner

## 2020-01-21 ENCOUNTER — Ambulatory Visit: Payer: 59 | Admitting: Nurse Practitioner

## 2020-01-21 ENCOUNTER — Other Ambulatory Visit (INDEPENDENT_AMBULATORY_CARE_PROVIDER_SITE_OTHER): Payer: 59

## 2020-01-21 VITALS — BP 120/78 | HR 80 | Ht 66.0 in | Wt 164.8 lb

## 2020-01-21 DIAGNOSIS — R1011 Right upper quadrant pain: Secondary | ICD-10-CM

## 2020-01-21 DIAGNOSIS — R7989 Other specified abnormal findings of blood chemistry: Secondary | ICD-10-CM

## 2020-01-21 DIAGNOSIS — K219 Gastro-esophageal reflux disease without esophagitis: Secondary | ICD-10-CM

## 2020-01-21 LAB — IBC + FERRITIN
Ferritin: 261.8 ng/mL (ref 22.0–322.0)
Iron: 91 ug/dL (ref 42–165)
Saturation Ratios: 23 % (ref 20.0–50.0)
Transferrin: 283 mg/dL (ref 212.0–360.0)

## 2020-01-21 MED ORDER — SUCRALFATE 1 G PO TABS: 1.0000 g | ORAL_TABLET | Freq: Three times a day (TID) | ORAL | 1 refills | Status: DC

## 2020-01-21 MED ORDER — FAMOTIDINE 20 MG PO TABS: 20.0000 mg | ORAL_TABLET | Freq: Two times a day (BID) | ORAL | 1 refills | Status: DC

## 2020-01-21 NOTE — Patient Instructions (Signed)
If you are age 43 or younger, your body mass index should be between 19-25. Your Body mass index is 26.6 kg/m. If this is out of the aformentioned range listed, please consider follow up with your Primary Care Provider.   LABS: Your provider has requested that you go to the basement level for lab work before leaving today. Press "B" on the elevator. The lab is located at the first door on the left as you exit the elevator.  HEALTHCARE LAWS AND MY CHART RESULTS: Due to recent changes in healthcare laws, you may see the results of your imaging and laboratory studies on MyChart before your provider has had a chance to review them.  We understand that in some cases there may be results that are confusing or concerning to you. Not all laboratory results come back in the same time frame and the provider may be waiting for multiple results in order to interpret others.  Please give [place] 48 hours in order for your provider to thoroughly review all the results before contacting the office for clarification of your results.   PRESCRIPTION MEDICATION(S): We have sent the following medication(s) to your pharmacy:  . Carafate - Please call the office at [phone] if you get constipated with this medication . Famotidine (may purchase over the counter)  Please follow up with our office in 3 weeks  Thank you for trusting me with your gastrointestinal care!    Willette Cluster, NP
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
## 2020-01-22 NOTE — Progress Notes (Signed)
I agree with the above note, plan 

## 2020-01-25 LAB — HEPATITIS C ANTIBODY
Hepatitis C Ab: NONREACTIVE
SIGNAL TO CUT-OFF: 0.02 (ref <1.00)

## 2020-01-25 LAB — HEPATITIS B SURFACE ANTIBODY,QUALITATIVE: Hep B S Ab: NONREACTIVE

## 2020-01-25 LAB — IGG: IgG (Immunoglobin G), Serum: 1282 mg/dL (ref 600–1640)

## 2020-01-25 LAB — MITOCHONDRIAL ANTIBODIES: Mitochondrial M2 Ab, IgG: <=20 U

## 2020-01-25 LAB — HEPATITIS B SURFACE ANTIGEN: Hepatitis B Surface Ag: NONREACTIVE

## 2020-01-25 LAB — ANA: Anti Nuclear Antibody (ANA): NEGATIVE

## 2020-01-25 LAB — ALPHA-1-ANTITRYPSIN: A-1 Antitrypsin, Ser: 121 mg/dL (ref 83–199)

## 2020-01-25 LAB — ANTI-SMOOTH MUSCLE ANTIBODY, IGG: Actin (Smooth Muscle) Antibody (IGG): <20 U (ref <20)

## 2020-01-25 LAB — TISSUE TRANSGLUTAMINASE, IGA: (tTG) Ab, IgA: <1 U/mL

## 2020-01-25 LAB — CERULOPLASMIN: Ceruloplasmin: 24 mg/dL (ref 18–36)

## 2020-02-11 ENCOUNTER — Ambulatory Visit (INDEPENDENT_AMBULATORY_CARE_PROVIDER_SITE_OTHER): Payer: 59 | Admitting: Nurse Practitioner

## 2020-02-11 ENCOUNTER — Encounter: Payer: Self-pay | Admitting: Nurse Practitioner

## 2020-02-11 VITALS — BP 138/98 | HR 60 | Ht 66.0 in | Wt 164.0 lb

## 2020-02-11 DIAGNOSIS — K219 Gastro-esophageal reflux disease without esophagitis: Secondary | ICD-10-CM

## 2020-02-11 DIAGNOSIS — R1011 Right upper quadrant pain: Secondary | ICD-10-CM | POA: Diagnosis not present

## 2020-02-11 NOTE — Patient Instructions (Signed)
If you are age 44 or older, your body mass index should be between 23-30. Your Body mass index is 26.47 kg/m. If this is out of the aforementioned range listed, please consider follow up with your Primary Care Provider.  If you are age 74 or younger, your body mass index should be between 19-25. Your Body mass index is 26.47 kg/m. If this is out of the aformentioned range listed, please consider follow up with your Primary Care Provider.   Continue Pepcid.   Follow up with Dr. Christella Hartigan
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
## 2020-02-12 ENCOUNTER — Encounter: Payer: Self-pay | Admitting: Nurse Practitioner

## 2020-02-12 NOTE — Progress Notes (Signed)
I agree with the above note, plan 

## 2020-03-30 ENCOUNTER — Ambulatory Visit: Payer: Self-pay | Admitting: Gastroenterology

## 2020-04-28 ENCOUNTER — Ambulatory Visit: Payer: 59 | Admitting: Nurse Practitioner

## 2021-11-15 ENCOUNTER — Ambulatory Visit: Payer: No Typology Code available for payment source | Admitting: Gastroenterology

## 2021-11-15 ENCOUNTER — Encounter: Payer: Self-pay | Admitting: Gastroenterology

## 2021-11-15 VITALS — BP 124/80 | HR 74 | Ht 65.0 in | Wt 174.0 lb

## 2021-11-15 DIAGNOSIS — R1011 Right upper quadrant pain: Secondary | ICD-10-CM

## 2021-11-15 MED ORDER — SUCRALFATE 1 G PO TABS: 1.0000 g | ORAL_TABLET | Freq: Two times a day (BID) | ORAL | 1 refills | Status: AC
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
## 2021-11-15 NOTE — Patient Instructions (Addendum)
_______________________________________________________  If you are age 46 or older, your body mass index should be between 23-30. Your Body mass index is 28.96 kg/m. If this is out of the aforementioned range listed, please consider follow up with your Primary Care Provider.  If you are age 60 or younger, your body mass index should be between 19-25. Your Body mass index is 28.96 kg/m. If this is out of the aformentioned range listed, please consider follow up with your Primary Care Provider.   ________________________________________________________  The [place] [facility] providers would like to encourage you to use [facility] to communicate with providers for non-urgent requests or questions.  Due to long hold times on the telephone, sending your provider a message by [facility] may be a faster and more efficient way to get a response.  Please allow 48 business hours for a response.  Please remember that this is for non-urgent requests.  _______________________________________________________  Stop omeprazole  We have sent the following medications to your pharmacy for you to pick up at your convenience: Carafate  Please go to any [facility] in 2 weeks to complete your Urea Breath Test. We have given you the order form as well  You have been scheduled for an appointment with Dr. Myrtie Neither on 01-09-2022 at 2pm . Please arrive 10 minutes early for your appointment.  It was a pleasure to see you today!  Thank you for trusting me with your gastrointestinal care!

## 2021-12-14 ENCOUNTER — Encounter: Payer: Self-pay | Admitting: Gastroenterology

## 2021-12-14 ENCOUNTER — Ambulatory Visit: Payer: No Typology Code available for payment source | Admitting: Gastroenterology

## 2021-12-14 VITALS — BP 118/80 | HR 80 | Ht 65.0 in | Wt 176.0 lb

## 2021-12-14 DIAGNOSIS — J302 Other seasonal allergic rhinitis: Secondary | ICD-10-CM

## 2021-12-14 DIAGNOSIS — R1011 Right upper quadrant pain: Secondary | ICD-10-CM

## 2021-12-14 NOTE — Progress Notes (Signed)
     [facility] Progress Note  Chief Complaint: Right upper quadrant pain  Subjective  History: Murice follows up after his visit with me a month ago.  Clinical details in that note, my impression was longstanding nonulcer dyspepsia, previously negative H. pylori testing.  Not much improvement on different PPIs over the years. He was given a trial of Carafate since that had apparently helped him in the past, plans were for discontinuation of omeprazole and a urea breath test 2 weeks later (does not appear to have been performed). Previous endoscopy at [facility] years ago, he wanted to hold off on that to try these things first.  Lin was seen with a Spanish video phone interpreter today.  His right upper quadrant pain which is still focal just at or under the midline rib cage is not improved with recent change in medicines.  He has also been experiencing what sounds like some allergy symptoms with nasal drainage and scratchy throat for the last week or so.  He says this has happened in the past with changes in the season, especially considering his work for a [employer].  ROS: Cardiovascular:  no chest pain Respiratory: no dyspnea Allergy symptoms as noted above The patient's Past Medical, Family and Social History were reviewed and are on file in the EMR.  Objective:  Med list reviewed  Current Outpatient Medications:    omeprazole (PRILOSEC) 40 MG capsule, Take 40 mg by mouth every morning., Disp: , Rfl:    PARoxetine (PAXIL-CR) 12.5 MG 24 hr tablet, Take 12.5 mg by mouth daily., Disp: , Rfl:    sucralfate (CARAFATE) 1 g tablet, Take 1 tablet (1 g total) by mouth 2 (two) times daily. (Patient not taking: Reported on 12/14/2021), Disp: 60 tablet, Rfl: 1   Vital signs in last 24 hrs: Vitals:   12/14/21 1400  BP: 118/80  Pulse: 80  SpO2: 97%   Wt Readings from Last 3 Encounters:  12/14/21 176 lb (79.8 kg)  11/15/21 174 lb (78.9 kg)  02/11/20 164 lb (74.4 kg)     Physical Exam  Normal vocal quality  Cardiac: Regular without murmur,  no peripheral edema Pulm: clear to auscultation bilaterally, normal RR and effort noted Abdomen: soft, point tenderness just at or under the costal margin midline right side as before, with active bowel sounds. No guarding or palpable hepatosplenomegaly.   Labs:   ___________________________________________ Radiologic studies:   ____________________________________________ Other:   _____________________________________________ Assessment & Plan  Assessment: Encounter Diagnoses  Name Primary?   RUQ pain Yes   Seasonal allergies    I still suspect this right upper quadrant pain may not be digestive in nature.  Strikes me is possibly musculoskeletal given the focal location of it with tenderness in that area and no improvement on different acid suppression medicines over the years, as well as previously unrevealing EGD.  Khaleem is also having some seasonal allergies, I recommended OTC Flonase spray and cetirizine tablets.  He was still reluctant to do the upper endoscopy, primarily related to the cost and the necessary time off from work. I have left that his discretion, but did recommend he try some lidocaine patches to the affected area as a last treatment can think to offer for this problem.  Nelida Meuse III

## 2021-12-14 NOTE — Patient Instructions (Signed)
_______________________________________________________  If you are age 46 or older, your body mass index should be between 23-30. Your Body mass index is 29.29 kg/m. If this is out of the aforementioned range listed, please consider follow up with your Primary Care Provider.  If you are age 102 or younger, your body mass index should be between 19-25. Your Body mass index is 29.29 kg/m. If this is out of the aformentioned range listed, please consider follow up with your Primary Care Provider.   ________________________________________________________  The [place] [facility] providers would like to encourage you to use [facility] to communicate with providers for non-urgent requests or questions.  Due to long hold times on the telephone, sending your provider a message by [facility] may be a faster and more efficient way to get a response.  Please allow 48 business hours for a response.  Please remember that this is for non-urgent requests.  _______________________________________________________  Stop taking the Carafate and the antiacids.  For allergies use these over the counter:  Flonase nasal spray 2.   Cetirizine 10 mg one a day.  Use over the counter Lidocaine patches, apply 1 a day to the affected area on the right upper quadrant.  It was a pleasure to see you today!  Thank you for trusting me with your gastrointestinal care!

## 2022-01-09 ENCOUNTER — Ambulatory Visit: Payer: No Typology Code available for payment source | Admitting: Gastroenterology

## 2022-12-11 ENCOUNTER — Encounter: Payer: Self-pay | Admitting: Family Medicine

## 2024-01-02 ENCOUNTER — Encounter: Payer: Self-pay | Admitting: Family Medicine
